# Patient Record
Sex: Female | Born: 1990
Health system: Southern US, Community
[De-identification: ages and names within clinical notes are randomized; demographics above are authoritative.]

## PROBLEM LIST (undated history)

## (undated) DIAGNOSIS — K219 Gastro-esophageal reflux disease without esophagitis: Secondary | ICD-10-CM

## (undated) DIAGNOSIS — R87629 Unspecified abnormal cytological findings in specimens from vagina: Secondary | ICD-10-CM

## (undated) DIAGNOSIS — J45909 Unspecified asthma, uncomplicated: Secondary | ICD-10-CM

## (undated) DIAGNOSIS — B029 Zoster without complications: Secondary | ICD-10-CM

## (undated) DIAGNOSIS — Z8719 Personal history of other diseases of the digestive system: Secondary | ICD-10-CM

## (undated) DIAGNOSIS — N189 Chronic kidney disease, unspecified: Secondary | ICD-10-CM

## (undated) DIAGNOSIS — E230 Hypopituitarism: Secondary | ICD-10-CM

## (undated) DIAGNOSIS — Z8711 Personal history of peptic ulcer disease: Secondary | ICD-10-CM

## (undated) DIAGNOSIS — K224 Dyskinesia of esophagus: Secondary | ICD-10-CM

## (undated) DIAGNOSIS — E039 Hypothyroidism, unspecified: Secondary | ICD-10-CM

## (undated) DIAGNOSIS — E063 Autoimmune thyroiditis: Secondary | ICD-10-CM

## (undated) DIAGNOSIS — K900 Celiac disease: Secondary | ICD-10-CM

## (undated) DIAGNOSIS — M1990 Unspecified osteoarthritis, unspecified site: Secondary | ICD-10-CM

## (undated) HISTORY — DX: Chronic kidney disease, unspecified: N18.9

## (undated) HISTORY — DX: Zoster without complications: B02.9

## (undated) HISTORY — DX: Personal history of other diseases of the digestive system: Z87.19

## (undated) HISTORY — DX: Unspecified asthma, uncomplicated: J45.909

## (undated) HISTORY — PX: NASAL SEPTUM SURGERY: SHX37

## (undated) HISTORY — PX: LEEP: SHX91

## (undated) HISTORY — DX: Dyskinesia of esophagus: K22.4

## (undated) HISTORY — DX: Personal history of peptic ulcer disease: Z87.11

## (undated) HISTORY — DX: Unspecified abnormal cytological findings in specimens from vagina: R87.629

## (undated) HISTORY — DX: Autoimmune thyroiditis: E06.3

## (undated) HISTORY — DX: Celiac disease: K90.0

## (undated) HISTORY — DX: Hypothyroidism, unspecified: E03.9

## (undated) HISTORY — DX: Unspecified osteoarthritis, unspecified site: M19.90

## (undated) HISTORY — DX: Gastro-esophageal reflux disease without esophagitis: K21.9

## (undated) HISTORY — DX: Hypopituitarism: E23.0

---
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
## 2010-03-30 ENCOUNTER — Ambulatory Visit: Payer: Self-pay | Admitting: Internal Medicine

## 2010-03-30 DIAGNOSIS — Z87898 Personal history of other specified conditions: Secondary | ICD-10-CM | POA: Insufficient documentation

## 2010-03-30 DIAGNOSIS — J45909 Unspecified asthma, uncomplicated: Secondary | ICD-10-CM | POA: Insufficient documentation

## 2010-03-30 DIAGNOSIS — F6389 Other impulse disorders: Secondary | ICD-10-CM | POA: Insufficient documentation

## 2010-07-10 ENCOUNTER — Ambulatory Visit: Payer: Self-pay | Admitting: Sports Medicine

## 2010-07-10 DIAGNOSIS — S335XXA Sprain of ligaments of lumbar spine, initial encounter: Secondary | ICD-10-CM

## 2010-07-10 DIAGNOSIS — M533 Sacrococcygeal disorders, not elsewhere classified: Secondary | ICD-10-CM | POA: Insufficient documentation

## 2010-07-10 DIAGNOSIS — M2170 Unequal limb length (acquired), unspecified site: Secondary | ICD-10-CM | POA: Insufficient documentation

## 2010-07-10 DIAGNOSIS — S339XXA Sprain of unspecified parts of lumbar spine and pelvis, initial encounter: Secondary | ICD-10-CM | POA: Insufficient documentation

## 2010-09-18 NOTE — Assessment & Plan Note (Signed)
Summary: TO BE EST/OK PER DOC/NJR   Vital Signs:  Patient profile:   20 year old female Menstrual status:  regular LMP:     03/18/2010 Height:      65 inches Weight:      119 pounds BMI:     19.87 Temp:     98.6 degrees F oral Pulse rate:   72 / minute BP sitting:   110 / 70  (left arm) Cuff size:   regular  Vitals Entered By: Sherron Monday, CMA (AAMA) (March 30, 2010 11:31 AM) CC: New Pt to get establish- Discuss asthma vs allergies. Pt is having trouble breathing. Mom would like pt to have thyroid checked as well. LMP (date): 03/18/2010     Menstrual Status regular Enter LMP: 03/18/2010   CC:  New Pt to get establish- Discuss asthma vs allergies. Pt is having trouble breathing. Mom would like pt to have thyroid checked as well.Marland Kitchen  History of Present Illness: new patient she describes SOB and chest tightness hx of asthma and allergies She describes occasional sensation of needing to take a deep breath. she denies wheezing.  Sensation typically occurs at rest. She is able to exercise aggressively   All other systems reviewed and were negative    Preventive Screening-Counseling & Management  Alcohol-Tobacco     Alcohol drinks/day: 0     Smoking Status: never  Caffeine-Diet-Exercise     Caffeine use/day: 0     Does Patient Exercise: yes      Drug Use:  no.    Current Medications (verified): 1)  None  Allergies (verified): No Known Drug Allergies  Past History:  Family History: Last updated: 03/30/2010 Father: High cholesterol, HBP Mother: Graves  Siblings: Sister- Healthy  Social History: Last updated: 03/30/2010 Never Smoked Alcohol use-no Drug use-no Regular exercise-yes Occupation: [employer]  Risk Factors: Alcohol Use: 0 (03/30/2010) Caffeine Use: 0 (03/30/2010) Exercise: yes (03/30/2010)  Risk Factors: Smoking Status: never (03/30/2010)  Past Medical History: Asthma  Past Surgical History: Broken Nose and Deviated  Septum- 2 separate surgeries  Family History: Father: High cholesterol, HBP Mother: Graves  Siblings: Sister- Healthy  Social History: Never Smoked Alcohol use-no Drug use-no Regular exercise-yes Occupation: [employer] Smoking Status:  never Caffeine use/day:  0 Does Patient Exercise:  yes Drug Use:  no   Impression & Recommendations:  Problem # 1:  ASTHMA (ICD-493.90)  Her updated medication list for this problem includes:    Proair Hfa 108 (90 Base) Mcg/act Aers (Albuterol sulfate) .Marland Kitchen... 2 puffs four times a day as needed  Orders: Venipuncture (95284) Specimen Handling (99000) TLB-TSH (Thyroid Stimulating Hormone) (84443-TSH)  Complete Medication List: 1)  Proair Hfa 108 (90 Base) Mcg/act Aers (Albuterol sulfate) .... 2 puffs four times a day as needed Prescriptions: PROAIR HFA 108 (90 BASE) MCG/ACT  AERS (ALBUTEROL SULFATE) 2 puffs four times a day as needed  #1 x 0   Entered and Authorized by:   Phoebe Sharps MD   Signed by:   Phoebe Sharps MD on 03/30/2010   Method used:   Electronically to        [facility]. [phone]* (retail)       [address]       [place], [place]  [address]       Ph: [phone] or [phone]       Fax: [phone]   RxID:   [id number]

## 2010-09-18 NOTE — Assessment & Plan Note (Signed)
Summary: PER FIELDS,MC   Vital Signs:  Patient profile:   20 year old female Menstrual status:  regular Height:      65.5 inches Weight:      120 pounds BMI:     19.74 Pulse rate:   93 / minute BP sitting:   104 / 68  (left arm)  Vitals Entered By: Caesar Chestnut RN (July 10, 2010 10:25 AM) CC: rt mid/lower back pain x1 week   CC:  rt mid/lower back pain x1 week.  History of Present Illness: 8 days ago woke up with RT sided back pain this has persisted inspite of naproxen and methocarbamol carries back pack woke up with this and gennerally felt bad that day [facility] that was normal  over past 8 days not much change back feels stiff pain loclizes to RT SI jont area Dr Earle Gell is a friend and she suggested coming here    Preventive Screening-Counseling & Management  Alcohol-Tobacco     Smoking Status: never  Allergies (verified): No Known Drug Allergies  Physical Exam  General:  Well-developed,well-nourished,in no acute distress; alert,appropriate and cooperative throughout examination Msk:  RT leg is longer than left by 1.5 cms  has spasm over RT quad lumborum Tight SIJ on RT that is tender to touch Pretzel stretch tight on RT only weak on hip abductors bilat  norm flex, ext, rotation and lat bending but some referred pain to quad lumb  norm heel , toe, tandem walk norm neuro ck   Impression & Recommendations:  Problem # 1:  SPRAIN AND STRAIN OF LUMBOSACRAL (ICD-846.0) Has strain primarily to quad lumborum mm also involves ligs to SIJ  use heat, ibuprofen as needed exercises stop methocarbamol  Problem # 2:  SACROILIAC JOINT DYSFUNCTION (ICD-724.6) The RT seems locked 2/2 leg length prob  will work with exercises and stretches  reck if not responding  Problem # 3:  UNEQUAL LEG LENGTH (ICD-736.81) wedged lift added to left shoe to try to correct  if comfortable use this in future in other  shoes  note gait is more level once this is placed  Complete Medication List: 1)  Proair Hfa 108 (90 Base) Mcg/act Aers (Albuterol sulfate) .... 2 puffs four times a day as needed  Patient Instructions: 1)  Pretzel stretch 3 x 30 seconds 2)  Knee to opposite shoulde rstretch 3 x 30 secs 3)  wear slight lift in left shoe 4)  daily a set of exercises 5)  start with easy back motion side to side 6)  forward and back 7)  rotation   Orders Added: 1)  New Patient Level III [39030]

## 2011-07-08 ENCOUNTER — Other Ambulatory Visit: Payer: Self-pay | Admitting: Anesthesiology

## 2011-07-08 DIAGNOSIS — R519 Headache, unspecified: Secondary | ICD-10-CM

## 2011-07-09 ENCOUNTER — Ambulatory Visit
Admission: RE | Admit: 2011-07-09 | Discharge: 2011-07-09 | Disposition: A | Discharge status: Home / Self Care | Payer: Managed Care, Other (non HMO) | Source: Ambulatory Visit | Attending: Anesthesiology | Admitting: Anesthesiology

## 2011-07-09 DIAGNOSIS — R519 Headache, unspecified: Secondary | ICD-10-CM

## 2011-09-23 ENCOUNTER — Encounter: Payer: Self-pay | Admitting: Internal Medicine

## 2011-09-23 ENCOUNTER — Telehealth: Payer: Self-pay | Admitting: Internal Medicine

## 2011-09-23 ENCOUNTER — Ambulatory Visit (INDEPENDENT_AMBULATORY_CARE_PROVIDER_SITE_OTHER): Payer: Managed Care, Other (non HMO) | Admitting: Internal Medicine

## 2011-09-23 ENCOUNTER — Ambulatory Visit
Admission: RE | Admit: 2011-09-23 | Discharge: 2011-09-23 | Disposition: A | Discharge status: Home / Self Care | Payer: Managed Care, Other (non HMO) | Source: Ambulatory Visit | Attending: Internal Medicine | Admitting: Internal Medicine

## 2011-09-23 DIAGNOSIS — J45909 Unspecified asthma, uncomplicated: Secondary | ICD-10-CM

## 2011-09-23 DIAGNOSIS — R109 Unspecified abdominal pain: Secondary | ICD-10-CM

## 2011-09-23 MED ORDER — TRAMADOL HCL 50 MG PO TABS: 50.0000 mg | ORAL_TABLET | Freq: Three times a day (TID) | ORAL | Status: AC | PRN

## 2011-09-23 MED ORDER — PROMETHAZINE HCL 12.5 MG PO TABS: 12.5000 mg | ORAL_TABLET | Freq: Four times a day (QID) | ORAL | Status: DC | PRN

## 2011-09-23 NOTE — Progress Notes (Signed)
  Subjective:    Patient ID: Kristine Taylor, female    DOB: 06/09/1991, 21 y.o.   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 This has worsened over the past 2 weeks and involves the left lower mid and upper left abdominal regions. There some discomfort also in the right flank area. No change in bowel habits but she has had some anorexia and nausea. She describes an increased satiety and some worsening discomfort following meals    Review of Systems  Constitutional: Negative.   HENT: Negative for hearing loss, congestion, sore throat, rhinorrhea, dental problem, sinus pressure and tinnitus.   Eyes: Negative for pain, discharge and visual disturbance.  Respiratory: Negative for cough and shortness of breath.   Cardiovascular: Negative for chest pain, palpitations and leg swelling.  Gastrointestinal: Positive for nausea and abdominal pain. Negative for vomiting, diarrhea, constipation, blood in stool, abdominal distention and rectal pain.  Genitourinary: Negative for dysuria, urgency, frequency, hematuria, flank pain, vaginal bleeding, vaginal discharge, difficulty urinating, vaginal pain and pelvic pain.  Musculoskeletal: Negative for joint swelling, arthralgias and gait problem.  Skin: Negative for rash.  Neurological: Negative for dizziness, syncope, speech difficulty, weakness, numbness and headaches.  Hematological: Negative for adenopathy.  Psychiatric/Behavioral: Negative for behavioral problems, dysphoric mood and agitation. The patient is not nervous/anxious.        Objective:   Physical Exam  Constitutional: She is oriented to person, place, and time. She appears well-developed and well-nourished. No distress.  Neck: No thyromegaly present.  Cardiovascular: Normal rate, regular rhythm, normal heart sounds and intact distal pulses.     Pulmonary/Chest: Effort normal and breath sounds normal. No respiratory distress. She has no wheezes. She has no rales. She exhibits no tenderness.  Abdominal: Soft. Bowel sounds are normal. She exhibits no distension and no mass. There is no tenderness. There is no rebound and no guarding.  Musculoskeletal: Normal range of motion.  Lymphadenopathy:    She has no cervical adenopathy.  Neurological: She is alert and oriented to person, place, and time.  Skin: Skin is warm and dry. No rash noted.  Psychiatric: She has a normal mood and affect. Her behavior is normal.          Assessment & Plan:     Left-sided abdominal pain of 2 weeks' duration associated nausea and anorexia. Pain seems to be worsening. We'll try to obtain an abdominal ultrasound today.

## 2011-09-23 NOTE — Patient Instructions (Signed)
Abdominal ultrasound as discussed today  Maintain hydration by drinking small amounts of clear fluids frequently, then soft diet, and then advance diet as tolerated.   Call if symptoms worsen, high fever, severe weakness or fainting, increased abdominal pain, blood in stool or vomit, or failure to improve in 2-3 days.

## 2011-09-23 NOTE — Telephone Encounter (Signed)
Please call pt on cell with abd ultrasound results

## 2011-09-26 NOTE — Telephone Encounter (Signed)
Pls advise.  

## 2011-10-17 NOTE — Telephone Encounter (Signed)
Left a message for pt to return call 

## 2011-10-17 NOTE — Telephone Encounter (Signed)
Please call/notify patient that lab/test/procedure is normal- how his patient doing??

## 2011-10-17 NOTE — Telephone Encounter (Signed)
Pt states she is still experiencing the same pain on the left side.  Pt has been on a gluten free diet for the last 10 days but pt's pain is still occurring.

## 2011-11-26 ENCOUNTER — Other Ambulatory Visit: Payer: Self-pay | Admitting: Gastroenterology

## 2011-11-26 DIAGNOSIS — R1032 Left lower quadrant pain: Secondary | ICD-10-CM

## 2011-11-28 ENCOUNTER — Ambulatory Visit
Admission: RE | Admit: 2011-11-28 | Discharge: 2011-11-28 | Disposition: A | Discharge status: Home / Self Care | Payer: Managed Care, Other (non HMO) | Source: Ambulatory Visit | Attending: Gastroenterology | Admitting: Gastroenterology

## 2011-11-28 DIAGNOSIS — R1032 Left lower quadrant pain: Secondary | ICD-10-CM

## 2011-11-28 MED ORDER — IOHEXOL 300 MG/ML  SOLN
100.0000 mL | Freq: Once | INTRAMUSCULAR | Status: AC | PRN
  Administered 2011-11-28: 100 mL via INTRAVENOUS

## 2012-04-17 DIAGNOSIS — G629 Polyneuropathy, unspecified: Secondary | ICD-10-CM | POA: Insufficient documentation

## 2012-05-01 DIAGNOSIS — G90521 Complex regional pain syndrome I of right lower limb: Secondary | ICD-10-CM | POA: Insufficient documentation

## 2012-11-19 ENCOUNTER — Other Ambulatory Visit: Payer: Self-pay | Admitting: Anesthesiology

## 2012-11-19 DIAGNOSIS — G5780 Other specified mononeuropathies of unspecified lower limb: Secondary | ICD-10-CM

## 2012-11-19 DIAGNOSIS — G609 Hereditary and idiopathic neuropathy, unspecified: Secondary | ICD-10-CM

## 2012-11-19 DIAGNOSIS — M545 Low back pain, unspecified: Secondary | ICD-10-CM

## 2012-11-19 DIAGNOSIS — IMO0002 Reserved for concepts with insufficient information to code with codable children: Secondary | ICD-10-CM

## 2012-11-25 ENCOUNTER — Other Ambulatory Visit: Payer: Managed Care, Other (non HMO)

## 2013-12-08 DIAGNOSIS — G894 Chronic pain syndrome: Secondary | ICD-10-CM | POA: Insufficient documentation

## 2013-12-08 DIAGNOSIS — M5416 Radiculopathy, lumbar region: Secondary | ICD-10-CM | POA: Insufficient documentation

## 2015-08-20 HISTORY — PX: ESOPHAGEAL MANOMETRY: SHX1526

## 2015-08-20 HISTORY — PX: PH IMPEDANCE STUDY: SHX5565

## 2015-12-18 HISTORY — PX: ESOPHAGOGASTRODUODENOSCOPY: SHX1529

## 2016-03-12 NOTE — Procedures (Signed)
Performed IN:[facility],  Florentina Addison on March 25, 2016 15:48 EDT               Complete swallows wih liquid and viscous. Reflux episodes no tin excess. trong correlaion between regurg symptoms and actual reflux. Acid well controlled by Dexilant at all times. Fragmented peristalsis with large breaks seen 50% of the time. This can be seen in reflux or in normal patients and is of unclear significance.    Plan:  Recomend empiric bethanecol to treat reflux and perceived dysphagia.  Signature Line     Electronically Signed on 03/25/2016 03:48 PM EDT   ________________________________________________   Laren Boom

## 2016-05-27 NOTE — Nursing Note (Signed)
Nursing Discharge Summary - Text       Nursing Discharge Summary Entered On:  05/27/2016 23:08 EDT    Performed On:  05/27/2016 23:06 EDT by Karolee OhsGREGOR, RN, NANCY A               DC Information   Discharge To, Anticipated :   Home with family support   Mode of Discharge :   Ambulatory   Transportation :   Private vehicle   Accompanied By :   Darlis LoanSpouse   GREGOR, RN, NANCY A - 05/27/2016 23:06 EDT   Education   Responsible Learner(s) :   No Data Available     Barriers To Learning :   None evident   Teaching Method :   Explanation, Printed materials   GREGOR, RN, Harriett SineNANCY A - 05/27/2016 23:06 EDT   Post-Hospital Education Adult Grid   Diagnostic Results :   Trenton GammonVerbalizes understanding   Disease Process :   Verbalizes understanding   Importance of Follow-Up Visits :   Verbalizes understanding   Plan of Care :   Verbalizes understanding   When to Call Health Care Provider :   [facility]   GREGOR, RN, Harriett SineNANCY A - 05/27/2016 23:06 EDT   Education Referral Made To :   Other: insrtucted to f/u with pcp and return to er for worsening   Additional Learner(s) Present :   Spouse, Significant other   Time Spent Educating Patient :   8 minutes   GREGOR, RN, NANCY A - 05/27/2016 23:06 EDT

## 2016-08-06 DIAGNOSIS — R1310 Dysphagia, unspecified: Secondary | ICD-10-CM | POA: Insufficient documentation

## 2016-08-19 HISTORY — PX: LEEP: SHX91

## 2018-06-03 LAB — VITAMIN B12: Vitamin B-12: 469 pg/mL (ref 232–1245)

## 2018-06-03 LAB — COMPREHENSIVE METABOLIC PANEL
ALT: 21 U/L (ref 0–33)
AST: 21 U/L (ref 0–32)
Albumin/Globulin Ratio: 1.9 mmol/L (ref 1.00–2.00)
Albumin: 4.8 g/dL (ref 3.5–5.2)
Alk Phosphatase: 55 U/L (ref 35–117)
Anion Gap: 9 mmol/L (ref 2–17)
BUN: 10 mg/dL (ref 6–20)
CO2: 30 mmol/L — ABNORMAL HIGH (ref 22–29)
Calcium: 9.6 mg/dL (ref 8.6–10.0)
Chloride: 103 mmol/L (ref 98–107)
Creatinine: 0.5 mg/dL (ref 0.5–0.9)
GFR African American: 154 mL/min/{1.73_m2} (ref >=90)
GFR Non-African American: 133 mL/min/{1.73_m2} (ref >=90)
Globulin: 3 g/dL (ref 1.9–4.4)
Glucose: 100 mg/dL — ABNORMAL HIGH (ref 70–99)
OSMOLALITY CALCULATED: 282 mOsm/kg (ref 270–287)
Potassium: 4 mmol/L (ref 3.5–5.3)
Sodium: 142 mmol/L (ref 135–145)
Total Bilirubin: 0.3 mg/dL (ref 0.00–1.20)
Total Protein: 7.3 g/dL (ref 6.4–8.3)

## 2018-06-03 LAB — CBC WITH AUTO DIFFERENTIAL
Absolute Eos #: 0.1 10*3/uL (ref 0.0–0.5)
Absolute Lymph #: 2 10*3/uL (ref 1.0–3.2)
Absolute Mono #: 0.5 10*3/uL (ref 0.3–1.0)
Basophils %: 0.6 % (ref 0.0–2.0)
Eosinophils %: 1.4 % (ref 0.0–7.0)
Hematocrit: 40.7 % (ref 34.0–47.0)
Hemoglobin: 14 g/dL (ref 11.5–15.7)
Lymphocytes: 39 % (ref 15.0–45.0)
MCH: 30.5 pg (ref 27.0–34.5)
MCHC: 34.3 g/dL (ref 32.0–36.0)
MCV: 89 fL (ref 81.0–99.0)
MPV: 8.9 fL (ref 7.2–13.2)
Monocytes: 10.3 % (ref 4.0–12.0)
Neutrophils %: 48.7 % (ref 42.0–74.0)
Neutrophils Absolute: 2.5 10*3/uL (ref 1.6–7.3)
Platelets: 243 10*3/uL (ref 140–440)
RBC: 4.58 x10e6/mcL (ref 3.60–5.20)
RDW: 12.9 % (ref 11.0–16.0)
WBC: 5.2 10*3/uL (ref 3.8–10.6)

## 2018-06-03 LAB — FERRITIN: Ferritin: 24.3 ng/mL (ref 13.0–150.0)

## 2018-06-03 LAB — TSH WITH REFLEX TO FT4: TSH: 2.74 mcIU/mL (ref 0.358–3.740)

## 2018-06-03 LAB — IRON: Iron: 77 ug/dL (ref 37–145)

## 2018-06-03 LAB — VITAMIN D 25 HYDROXY: Vit D, 25-Hydroxy: 31.3 ng/mL (ref 30.0–90.0)

## 2018-08-19 NOTE — L&D Delivery Note (Signed)
Delivery Note At 5:12 PM a viable female was delivered via Vaginal, Spontaneous (Presentation:DOA ;  ).  APGAR: 9, 9; weight  . pending  Placenta status: spont, intact, .  Cord: 3VC with the following complications: .  Cord pH: n/a  Anesthesia:  epidural Episiotomy: Left Mediolateral Lacerations: Periurethral- b/l Suture Repair: 3.0 vicryl rapide 4.0 vicryl rapideto b/l periurethral lacerations, interrupted sutrues, 4 on R, 1 on L Est. Blood Loss (mL): 200  Mom to postpartum.  Baby to Couplet care / Skin to Skin.  Ala Dach 07/23/2019, 5:47 PM

## 2018-08-25 ENCOUNTER — Encounter: Payer: Self-pay | Admitting: Internal Medicine

## 2018-08-26 ENCOUNTER — Ambulatory Visit: Payer: Self-pay | Admitting: Family Medicine

## 2018-09-04 ENCOUNTER — Encounter: Payer: Self-pay | Admitting: Family Medicine

## 2018-09-04 ENCOUNTER — Ambulatory Visit (INDEPENDENT_AMBULATORY_CARE_PROVIDER_SITE_OTHER): Payer: Managed Care, Other (non HMO) | Admitting: Family Medicine

## 2018-09-04 VITALS — BP 94/62 | HR 78 | Temp 98.2°F | Ht 65.0 in | Wt 118.0 lb

## 2018-09-04 DIAGNOSIS — M1990 Unspecified osteoarthritis, unspecified site: Secondary | ICD-10-CM

## 2018-09-04 DIAGNOSIS — K900 Celiac disease: Secondary | ICD-10-CM

## 2018-09-04 DIAGNOSIS — Z0000 Encounter for general adult medical examination without abnormal findings: Secondary | ICD-10-CM | POA: Diagnosis not present

## 2018-09-04 DIAGNOSIS — K5900 Constipation, unspecified: Secondary | ICD-10-CM | POA: Diagnosis not present

## 2018-09-04 DIAGNOSIS — E039 Hypothyroidism, unspecified: Secondary | ICD-10-CM | POA: Insufficient documentation

## 2018-09-04 DIAGNOSIS — N12 Tubulo-interstitial nephritis, not specified as acute or chronic: Secondary | ICD-10-CM | POA: Insufficient documentation

## 2018-09-04 DIAGNOSIS — Z8719 Personal history of other diseases of the digestive system: Secondary | ICD-10-CM

## 2018-09-04 DIAGNOSIS — K224 Dyskinesia of esophagus: Secondary | ICD-10-CM | POA: Diagnosis not present

## 2018-09-04 MED ORDER — HYDROCORTISONE ACETATE 25 MG RE SUPP: 25.0000 mg | Freq: Two times a day (BID) | RECTAL | 0 refills | Status: DC

## 2018-09-04 NOTE — Progress Notes (Signed)
Subjective:     Kristine Taylor is a 28 y.o. female with pmh sig for shingles (2008), hypothyroidism, history of asthma, arthritis, esophageal spasm, motility disorder, constipation, gastric ulcers  who is here for a comprehensive physical exam.   H/o hemorrhoids: -pt notes episode of tissue coming out of her anus several yrs ago. -pt had recent episode.  Notes looked like a "red tennis ball" sticking out -pt denies pruritus, pain -Endorses history of constipation -Has not taken nothing for her symptoms.  States tissue is typically "setback" after a few minutes at most.  History of GI issues: -Patient endorses history of esophageal spasms, motility disorder, celiac disease, constipation -Has upcoming GI appointment in February. -Patient notes eating meds make symptoms worse -Was taking Zantac x3 years prior to medication recall -Now taking Dexilant  Hypothyroidism: -Diagnosed January 2019 after having increased hair thinning. -Currently taking levothyroxine 50 mcg daily -Last TSH was in October -Endorses irregular menses,?  Continued hair thinning?  History of asthma: -Mostly as a child -No symptoms since  Fertility difficulty: -endorses history of irregular/absent menses for years -Has appointment with reproductive endocrinology at the end of February.  -Currently taking letrozole on day 3 through 7 of cycle and Provera -LMP 08/28/2018 -Patient endorses history of abnormal Pap in the past -Last Pap October 2019  Arthritis: -Patient endorses occasional pain in low back, L4-5 -Was taking Flexeril and another med, but stopped as then made her drowsy -received steroid injections in the past  History of kidney disease: -Pt does not recall details -States was [facility]  Allergies: NKDA  Past surgical history: Rhinoplasty Deviated septum repair Wisdom teeth extraction  Social history: Patient is married.  She and her husband are trying to have  children.  Patient is currently looking for employment since moving to the area from [place].  Patient endorses social alcohol use.  Patient denies tobacco and drug use.  Family medical history: Mom-AAW Dad-AW Sister-alive, asthma PGM-deceased, diabetes Social History   Socioeconomic History  . Marital status: Unknown    Spouse name: Not on file  . Number of children: Not on file  . Years of education: Not on file  . Highest education level: Not on file  Occupational History  . Not on file  Social Needs  . Financial resource strain: Not on file  . Food insecurity:    Worry: Not on file    Inability: Not on file  . Transportation needs:    Medical: Not on file    Non-medical: Not on file  Tobacco Use  . Smoking status: Never Smoker  . Smokeless tobacco: Never Used  Substance and Sexual Activity  . Alcohol use: Yes    Comment: OCCASIONAL  . Drug use: Never  . Sexual activity: Not on file  Lifestyle  . Physical activity:    Days per week: Not on file    Minutes per session: Not on file  . Stress: Not on file  Relationships  . Social connections:    Talks on phone: Not on file    Gets together: Not on file    Attends religious service: Not on file    Active member of club or organization: Not on file    Attends meetings of clubs or organizations: Not on file    Relationship status: Not on file  . Intimate partner violence:    Fear of current or ex partner: Not on file    Emotionally abused: Not on file  Physically abused: Not on file    Forced sexual activity: Not on file  Other Topics Concern  . Not on file  Social History Narrative  . Not on file   Health Maintenance  Topic Date Due  . HIV Screening  06/30/2005  . TETANUS/TDAP  06/30/2009  . PAP-Cervical Cytology Screening  07/01/2011  . PAP SMEAR-Modifier  07/01/2011  . INFLUENZA VACCINE  03/19/2018    The following portions of the patient's history were reviewed and updated as appropriate: allergies,  current medications, past family history, past medical history, past social history, past surgical history and problem list.  Review of Systems Pertinent items noted in HPI and remainder of comprehensive ROS otherwise negative.   Objective:    BP 94/62 (BP Location: Left Arm, Patient Position: Sitting, Cuff Size: Normal)   Pulse 78   Temp 98.2 F (36.8 C) (Oral)   Ht 5' 5"  (1.651 m)   Wt 118 lb (53.5 kg)   SpO2 98%   BMI 19.64 kg/m  General appearance: alert, cooperative and no distress Head: Normocephalic, without obvious abnormality, atraumatic Eyes: conjunctivae/corneas clear. PERRL, EOM's intact. Fundi benign. Ears: normal TM's and external ear canals both ears Nose: Nares normal. Septum midline. Mucosa normal. No drainage or sinus tenderness. Throat: lips, mucosa, and tongue normal; teeth and gums normal Neck: no adenopathy, no carotid bruit, no JVD, supple, symmetrical, trachea midline and thyroid not enlarged, symmetric, no tenderness/mass/nodules Lungs: clear to auscultation bilaterally Heart: regular rate and rhythm, S1, S2 normal, no murmur, click, rub or gallop Abdomen: soft, non-tender; bowel sounds normal; no masses,  no organomegaly Extremities: extremities normal, atraumatic, no cyanosis or edema Skin: Skin color, texture, turgor normal. No rashes or lesions Neurologic: Alert and oriented X 3, normal strength and tone. Normal symmetric reflexes. Normal coordination and gait    Assessment:    Healthy female exam.     Plan:     Anticipatory guidance given including wearing seatbelts, smoke detectors in the home, increasing physical activity, increasing p.o. intake of water and vegetables. -will wait on labs as will likely have them done at Reproductive Endo visit -pap up-to-date.  Done October 2019 -Immunizations up-to-date -Given handout -Next CPE in 1 year See After Visit Summary for Counseling Recommendations    History of hemorrhoids -Rx for Anusol  given  Celiac disease -Avoid gluten -Encouraged to keep follow-up with GI in February  Constipation: -Discussed increasing p.o. intake of fiber, considering low FODMAP diet, increasing p.o. intake of water -Continue Dexilant -Keep follow-up with GI in February  Esophageal spasm -Avoid foods known to cause symptoms -Encouraged to keep follow-up with GI in February  Hypothyroidism: -We will wait on labs -Continue levothyroxine 50 mcg daily -We will order labs if needed after patient's follow-up with reproductive endocrinology  Arthritis -Discussed stretching and other ways to relieve pain/discomfort -Use NSAIDs sparingly given GI issues  Follow-up PRN  Grier Mitts, MD

## 2018-09-04 NOTE — Patient Instructions (Signed)
Preventive Care 18-39 Years, Female Preventive care refers to lifestyle choices and visits with your health care provider that can promote health and wellness. What does preventive care include?   A yearly physical exam. This is also called an annual well check.  Dental exams once or twice a year.  Routine eye exams. Ask your health care provider how often you should have your eyes checked.  Personal lifestyle choices, including: ? Daily care of your teeth and gums. ? Regular physical activity. ? Eating a healthy diet. ? Avoiding tobacco and drug use. ? Limiting alcohol use. ? Practicing safe sex. ? Taking vitamin and mineral supplements as recommended by your health care provider. What happens during an annual well check? The services and screenings done by your health care provider during your annual well check will depend on your age, overall health, lifestyle risk factors, and family history of disease. Counseling Your health care provider may ask you questions about your:  Alcohol use.  Tobacco use.  Drug use.  Emotional well-being.  Home and relationship well-being.  Sexual activity.  Eating habits.  Work and work environment.  Method of birth control.  Menstrual cycle.  Pregnancy history. Screening You may have the following tests or measurements:  Height, weight, and BMI.  Diabetes screening. This is done by checking your blood sugar (glucose) after you have not eaten for a while (fasting).  Blood pressure.  Lipid and cholesterol levels. These may be checked every 5 years starting at age 20.  Skin check.  Hepatitis C blood test.  Hepatitis B blood test.  Sexually transmitted disease (STD) testing.  BRCA-related cancer screening. This may be done if you have a family history of breast, ovarian, tubal, or peritoneal cancers.  Pelvic exam and Pap test. This may be done every 3 years starting at age 21. Starting at age 30, this may be done every 5  years if you have a Pap test in combination with an HPV test. Discuss your test results, treatment options, and if necessary, the need for more tests with your health care provider. Vaccines Your health care provider may recommend certain vaccines, such as:  Influenza vaccine. This is recommended every year.  Tetanus, diphtheria, and acellular pertussis (Tdap, Td) vaccine. You may need a Td booster every 10 years.  Varicella vaccine. You may need this if you have not been vaccinated.  HPV vaccine. If you are 26 or younger, you may need three doses over 6 months.  Measles, mumps, and rubella (MMR) vaccine. You may need at least one dose of MMR. You may also need a second dose.  Pneumococcal 13-valent conjugate (PCV13) vaccine. You may need this if you have certain conditions and were not previously vaccinated.  Pneumococcal polysaccharide (PPSV23) vaccine. You may need one or two doses if you smoke cigarettes or if you have certain conditions.  Meningococcal vaccine. One dose is recommended if you are age 19-21 years and a first-year college student living in a residence hall, or if you have one of several medical conditions. You may also need additional booster doses.  Hepatitis A vaccine. You may need this if you have certain conditions or if you travel or work in places where you may be exposed to hepatitis A.  Hepatitis B vaccine. You may need this if you have certain conditions or if you travel or work in places where you may be exposed to hepatitis B.  Haemophilus influenzae type b (Hib) vaccine. You may need this if you   have certain risk factors. Talk to your health care provider about which screenings and vaccines you need and how often you need them. This information is not intended to replace advice given to you by your health care provider. Make sure you discuss any questions you have with your health care provider. Document Released: 10/01/2001 Document Revised: 03/18/2017  Document Reviewed: 06/06/2015 Elsevier Interactive Patient Education  2019 [employer]  Esophageal Spasm  An esophageal spasm is a sudden tightening (contraction) of the part of the body that moves food from the mouth to the stomach (esophagus). Normally, smooth, wave-like muscle contractions move food and liquids down the esophagus. Esophageal spasms are abnormal muscle contractions that can cause chest pain and trouble swallowing (dysphagia). Spasms may also cause swallowed foods or liquids to come back up into the throat (regurgitation). There are two types of esophageal spasms. You may have one or both types:  Diffuse esophageal spasms. These are irregular, uncoordinated spasms. This type tends to cause more dysphagia.  Nutcracker esophagus. This is a type of spasm in which the muscles move normally, but the contraction is very strong. This type tends to be more painful. Severe esophageal spasms can make it hard to eat and do everyday activities. They often occur with severe heartburn (reflux esophagitis). The symptoms can come and go and may be triggered or worsened depending on your diet or other medical issues. What are the causes? The cause of esophageal spasms is not known. What increases the risk? The following factors may make you more likely to develop esophageal spasms:  Being female.  Age. The risk may increase as you get older.  Depression or anxiety.  Having GERD (gastroesophageal reflux disease). What are the signs or symptoms? Symptoms may vary from day to day. They may be mild or severe. They may last for minutes or hours. Common symptoms include:  Chest pain. This may feel like a heart attack.  Back pain.  Dysphagia.  Heartburn.  A feeling that something is stuck in the throat (globus).  Regurgitation of foods or liquids. For some people, certain things may trigger symptoms, such as:  Certain foods and drinks. These may include very hot or very cold  foods or drinks.  Eating very quickly. How is this diagnosed? This condition may be diagnosed based on your symptoms and a physical exam. You may have tests, such as:  Endoscopy. This involves using a flexible tube that has a camera on the end of it (endoscope) to look down your throat and examine your esophagus.  Barium swallow. This involves drinking a substance that will show up well on X-rays (barium) and then having X-rays to see how the substance moves through your esophagus.  Esophageal manometry. This involves passing a small, thin tube through your nose and down into your throat. The tube contains pressure sensors that measure muscle contractions in the esophagus while you swallow. How is this treated? Mild esophageal spasms may not need treatment. You may be able to manage the spasms by avoiding triggers. For more frequent or severe spasms, treatment may include:  Medicine to: ? Relax the esophageal muscles. ? Relieve muscle spasms (calcium channel blockers and nitrates). ? Relieve pain by blocking nerve endings in the esophagus. This is done with an injection of a toxin (botulinum). ? Relieve heartburn (proton pump inhibitors).  Antidepressant medicines. These are sometimes used to ease symptoms.  Surgery to reduce esophageal muscle contractions (myotomy), in very severe cases. Follow these instructions at home: Eating and  drinking  Keep track of foods, drinks, and habits that trigger spasms or heartburn. Avoid these triggers as much as you can.  Eat meals slowly. Chew food completely before swallowing.  Avoid swallowing foods and drinks when they are very hot or very cold. General instructions  Take over-the-counter and prescription medicines only as told by your health care provider.  Find ways to manage stress, such as regular exercise or meditation.  If you struggle with depression or anxiety, talk with your health care provider about treatment options.  Keep all  follow-up visits as told by your health care provider. This is important. Contact a health care provider if:  Your symptoms get worse or do not get better with medicine.  You are losing weight because of dysphagia.  Your esophageal spasms affect your quality of life, such as your ability to eat. Get help right away if:  You have severe chest pain.  You have chest pain that is different from your usual chest pain.  You have trouble breathing.  You choke. Summary  An esophageal spasm is a sudden tightening (contraction) of the part of the body that moves food from the mouth to the stomach (esophagus). These abnormal muscle contractions can cause chest pain and trouble swallowing (dysphagia).  The cause of esophageal spasms is not known.  Treatment may not be needed for mild spasms. For frequent or more severe spasms, treatment may include medicine, or, for very severe spasms, surgery.  Keep track of foods, drinks, and habits that trigger spasms or heartburn. Avoid these triggers as much as you can. This information is not intended to replace advice given to you by your health care provider. Make sure you discuss any questions you have with your health care provider. Document Released: 10/26/2002 Document Revised: 05/06/2017 Document Reviewed: 05/06/2017 Elsevier Interactive Patient Education  2019 [employer]  Hemorrhoids Hemorrhoids are swollen veins in and around the rectum or anus. There are two types of hemorrhoids:  Internal hemorrhoids. These occur in the veins that are just inside the rectum. They may poke through to the outside and become irritated and painful.  External hemorrhoids. These occur in the veins that are outside the anus and can be felt as a painful swelling or hard lump near the anus. Most hemorrhoids do not cause serious problems, and they can be managed with home treatments such as diet and lifestyle changes. If home treatments do not help the symptoms,  procedures can be done to shrink or remove the hemorrhoids. What are the causes? This condition is caused by increased pressure in the anal area. This pressure may result from various things, including:  Constipation.  Straining to have a bowel movement.  Diarrhea.  Pregnancy.  Obesity.  Sitting for long periods of time.  Heavy lifting or other activity that causes you to strain.  Anal sex.  Riding a bike for a long period of time. What are the signs or symptoms? Symptoms of this condition include:  Pain.  Anal itching or irritation.  Rectal bleeding.  Leakage of stool (feces).  Anal swelling.  One or more lumps around the anus. How is this diagnosed? This condition can often be diagnosed through a visual exam. Other exams or tests may also be done, such as:  An exam that involves feeling the rectal area with a gloved hand (digital rectal exam).  An exam of the anal canal that is done using a small tube (anoscope).  A blood test, if you have lost a  significant amount of blood.  A test to look inside the colon using a flexible tube with a camera on the end (sigmoidoscopy or colonoscopy). How is this treated? This condition can usually be treated at home. However, various procedures may be done if dietary changes, lifestyle changes, and other home treatments do not help your symptoms. These procedures can help make the hemorrhoids smaller or remove them completely. Some of these procedures involve surgery, and others do not. Common procedures include:  Rubber band ligation. Rubber bands are placed at the base of the hemorrhoids to cut off their blood supply.  Sclerotherapy. Medicine is injected into the hemorrhoids to shrink them.  Infrared coagulation. A type of light energy is used to get rid of the hemorrhoids.  Hemorrhoidectomy surgery. The hemorrhoids are surgically removed, and the veins that supply them are tied off.  Stapled hemorrhoidopexy surgery. The  surgeon staples the base of the hemorrhoid to the rectal wall. Follow these instructions at home: Eating and drinking   Eat foods that have a lot of fiber in them, such as whole grains, beans, nuts, fruits, and vegetables.  Ask your health care provider about taking products that have added fiber (fiber supplements).  Reduce the amount of fat in your diet. You can do this by eating low-fat dairy products, eating less red meat, and avoiding processed foods.  Drink enough fluid to keep your urine pale yellow. Managing pain and swelling   Take warm sitz baths for 20 minutes, 3-4 times a day to ease pain and discomfort. You may do this in a bathtub or using a portable sitz bath that fits over the toilet.  If directed, apply ice to the affected area. Using ice packs between sitz baths may be helpful. ? Put ice in a plastic bag. ? Place a towel between your skin and the bag. ? Leave the ice on for 20 minutes, 2-3 times a day. General instructions  Take over-the-counter and prescription medicines only as told by your health care provider.  Use medicated creams or suppositories as told.  Get regular exercise. Ask your health care provider how much and what kind of exercise is best for you. In general, you should do moderate exercise for at least 30 minutes on most days of the week (150 minutes each week). This can include activities such as walking, biking, or yoga.  Go to the bathroom when you have the urge to have a bowel movement. Do not wait.  Avoid straining to have bowel movements.  Keep the anal area dry and clean. Use wet toilet paper or moist towelettes after a bowel movement.  Do not sit on the toilet for long periods of time. This increases blood pooling and pain.  Keep all follow-up visits as told by your health care provider. This is important. Contact a health care provider if you have:  Increasing pain and swelling that are not controlled by treatment or  medicine.  Difficulty having a bowel movement, or you are unable to have a bowel movement.  Pain or inflammation outside the area of the hemorrhoids. Get help right away if you have:  Uncontrolled bleeding from your rectum. Summary  Hemorrhoids are swollen veins in and around the rectum or anus.  Most hemorrhoids can be managed with home treatments such as diet and lifestyle changes.  Taking warm sitz baths can help ease pain and discomfort.  In severe cases, procedures or surgery can be done to shrink or remove the hemorrhoids. This information  is not intended to replace advice given to you by your health care provider. Make sure you discuss any questions you have with your health care provider. Document Released: 08/02/2000 Document Revised: 12/25/2017 Document Reviewed: 12/25/2017 Elsevier Interactive Patient Education  2019 [employer].  Constipation, Adult Constipation is when a person has fewer bowel movements in a week than normal, has difficulty having a bowel movement, or has stools that are dry, hard, or larger than normal. Constipation may be caused by an underlying condition. It may become worse with age if a person takes certain medicines and does not take in enough fluids. Follow these instructions at home: Eating and drinking   Eat foods that have a lot of fiber, such as fresh fruits and vegetables, whole grains, and beans.  Limit foods that are high in fat, low in fiber, or overly processed, such as french fries, hamburgers, cookies, candies, and soda.  Drink enough fluid to keep your urine clear or pale yellow. General instructions  Exercise regularly or as told by your health care provider.  Go to the restroom when you have the urge to go. Do not hold it in.  Take over-the-counter and prescription medicines only as told by your health care provider. These include any fiber supplements.  Practice pelvic floor retraining exercises, such as deep breathing  while relaxing the lower abdomen and pelvic floor relaxation during bowel movements.  Watch your condition for any changes.  Keep all follow-up visits as told by your health care provider. This is important. Contact a health care provider if:  You have pain that gets worse.  You have a fever.  You do not have a bowel movement after 4 days.  You vomit.  You are not hungry.  You lose weight.  You are bleeding from the anus.  You have thin, pencil-like stools. Get help right away if:  You have a fever and your symptoms suddenly get worse.  You leak stool or have blood in your stool.  Your abdomen is bloated.  You have severe pain in your abdomen.  You feel dizzy or you faint. This information is not intended to replace advice given to you by your health care provider. Make sure you discuss any questions you have with your health care provider. Document Released: 05/03/2004 Document Revised: 02/23/2016 Document Reviewed: 01/24/2016 Elsevier Interactive Patient Education  2019 [employer].

## 2018-09-07 ENCOUNTER — Encounter: Payer: Self-pay | Admitting: Internal Medicine

## 2018-09-09 DIAGNOSIS — Z3149 Encounter for other procreative investigation and testing: Secondary | ICD-10-CM | POA: Diagnosis not present

## 2018-09-24 DIAGNOSIS — Z319 Encounter for procreative management, unspecified: Secondary | ICD-10-CM | POA: Diagnosis not present

## 2018-09-26 ENCOUNTER — Ambulatory Visit (HOSPITAL_COMMUNITY)
Admission: EM | Admit: 2018-09-26 | Discharge: 2018-09-26 | Disposition: A | Discharge status: Home / Self Care | Payer: BLUE CROSS/BLUE SHIELD | Attending: Emergency Medicine | Admitting: Emergency Medicine

## 2018-09-26 ENCOUNTER — Encounter (HOSPITAL_COMMUNITY): Payer: Self-pay | Admitting: Emergency Medicine

## 2018-09-26 DIAGNOSIS — R519 Headache, unspecified: Secondary | ICD-10-CM

## 2018-09-26 DIAGNOSIS — R51 Headache: Secondary | ICD-10-CM | POA: Diagnosis not present

## 2018-09-26 MED ORDER — IBUPROFEN 800 MG PO TABS: 800.0000 mg | ORAL_TABLET | Freq: Three times a day (TID) | ORAL | 0 refills | Status: DC

## 2018-09-26 MED ORDER — VALACYCLOVIR HCL 1 G PO TABS: 1000.0000 mg | ORAL_TABLET | Freq: Three times a day (TID) | ORAL | 0 refills | Status: AC

## 2018-09-26 NOTE — ED Provider Notes (Signed)
[place]    CSN: [id number] Arrival date & time: 09/26/18  1001     History   Chief Complaint Chief Complaint  Patient presents with  . Facial Pain    HPI Kristine Taylor is a 28 y.o. female history of asthma, GERD presenting today for evaluation of facial pain.  Patient states that on the left side of her face near her ear as well as temporal area as she has had pain.  Feels as if it is throbbing and tender.  She has not noticed any changes to the skin.  Symptoms began yesterday morning.  States that the pain feels very similar to when she previously had shingles on her back.  She has not noticed any rashes.  She notices pain near her ear with opening mouth and chewing.  She denies associated URI symptoms of congestion, cough or sore throat.  She denies any fevers.  She has not taken anything for symptoms.  Denies any neck swelling or difficulty moving neck.  Denies any vision changes.  Denies scalp tenderness.  HPI  Past Medical History:  Diagnosis Date  . Arthritis   . Asthma   . Asthma   . Celiac disease   . Chronic kidney disease   . GERD (gastroesophageal reflux disease)   . History of stomach ulcers   . Thyroid disease     Patient Active Problem List   Diagnosis Date Noted  . H/O hemorrhoids 09/04/2018  . Celiac disease 09/04/2018  . Constipation 09/04/2018  . Esophageal spasm 09/04/2018  . Acquired hypothyroidism 09/04/2018  . Arthritis 09/04/2018  . SACROILIAC JOINT DYSFUNCTION 07/10/2010  . 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 SPRAIN AND STRAIN OF LUMBOSACRAL 07/10/2010  . 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 ASTHMA 03/30/2010  . SHINGLES, HX OF 03/30/2010    Past Surgical History:  Procedure Laterality Date  . broken nose    . NASAL SEPTUM SURGERY    . NOSE REPAIR  2006    OB History   No obstetric history on file.      Home Medications    Prior to Admission medications   Medication Sig Start Date End Date Taking? Authorizing Provider    albuterol (PROVENTIL HFA;VENTOLIN HFA) 108 (90 BASE) MCG/ACT inhaler Inhale 2 puffs into the lungs every 6 (six) hours as needed.    [provider]  dexlansoprazole (DEXILANT) 60 MG capsule Take 60 mg by mouth daily.    [provider]  hydrocortisone (ANUSOL-HC) 25 MG suppository Place 1 suppository (25 mg total) rectally 2 (two) times daily. 09/04/18   Billie Ruddy, MD  ibuprofen (ADVIL,MOTRIN) 800 MG tablet Take 1 tablet (800 mg total) by mouth 3 (three) times daily. 09/26/18   Daizy Outen C, PA-C  levothyroxine (SYNTHROID, LEVOTHROID) 50 MCG tablet Take 50 mcg by mouth daily before breakfast.    [provider]  metoCLOPramide (REGLAN) 5 MG tablet Take 5 mg by mouth daily.    [provider]  Prenatal Vit-Fe Fumarate-FA (MULTIVITAMIN-PRENATAL) 27-0.8 MG TABS tablet Take 1 tablet by mouth daily at 12 noon.    [provider]  Probiotic Product (PROBIOTIC DAILY PO) Take by mouth daily.    [provider]  valACYclovir (VALTREX) 1000 MG tablet Take 1 tablet (1,000 mg total) by mouth 3 (three) times daily for 14 days. 09/26/18 10/10/18  Azadeh Hyder, Elesa Hacker, PA-C    Family History Family History  Problem Relation Age of Onset  . Graves' disease Mother   .  Hyperlipidemia Father   . Hypertension Father   . Healthy Sister     Social History Social History   Tobacco Use  . Smoking status: Never Smoker  . Smokeless tobacco: Never Used  Substance Use Topics  . Alcohol use: Yes    Comment: OCCASIONAL  . Drug use: Never     Allergies   Patient has no known allergies.   Review of Systems Review of Systems  Constitutional: Negative for activity change, appetite change, chills, fatigue and fever.  HENT: Positive for ear pain. Negative for congestion, rhinorrhea, sinus pressure, sore throat and trouble swallowing.   Eyes: Negative for discharge and redness.  Respiratory: Negative for cough, chest tightness and shortness of breath.    Cardiovascular: Negative for chest pain.  Gastrointestinal: Negative for abdominal pain, diarrhea, nausea and vomiting.  Musculoskeletal: Negative for myalgias.  Skin: Negative for rash.  Neurological: Negative for dizziness, light-headedness and headaches.     Physical Exam Triage Vital Signs ED Triage Vitals [09/26/18 1013]  Enc Vitals Group     BP 97/65     Pulse Rate 88     Resp 18     Temp 98.4 F (36.9 C)     Temp Source Oral     SpO2 99 %     Weight      Height      Head Circumference      Peak Flow      Pain Score 3     Pain Loc      Pain Edu?      Excl. in [place]?    No data found.  Updated Vital Signs BP 97/65 (BP Location: Right Arm)   Pulse 88   Temp 98.4 F (36.9 C) (Oral)   Resp 18   SpO2 99%   Visual Acuity Right Eye Distance:   Left Eye Distance:   Bilateral Distance:    Right Eye Near:   Left Eye Near:    Bilateral Near:     Physical Exam Vitals signs and nursing note reviewed.  Constitutional:      General: She is not in acute distress.    Appearance: She is well-developed.     Comments: No acute distress  HENT:     Head: Normocephalic and atraumatic.     Comments: Mildly tender near area of TMJ on left side, mild tenderness to upper for head near scalp line, no overlying skin changes or erythema, no palpable swelling Nontender on scalp    Ears:     Comments: Bilateral ears without tenderness to palpation of external auricle, tragus and mastoid, EAC's without erythema or swelling, TM's with good bony landmarks and cone of light. Non erythematous.    Nose: Nose normal.     Mouth/Throat:     Comments: Oral mucosa pink and moist, no tonsillar enlargement or exudate. Posterior pharynx patent and nonerythematous, no uvula deviation or swelling. Normal phonation. Able to fully open jaw Eyes:     Extraocular Movements: Extraocular movements intact.     Conjunctiva/sclera: Conjunctivae normal.     Pupils: Pupils are equal, round, and reactive  to light.  Neck:     Musculoskeletal: Neck supple.  Cardiovascular:     Rate and Rhythm: Normal rate and regular rhythm.     Heart sounds: No murmur.  Pulmonary:     Effort: Pulmonary effort is normal. No respiratory distress.     Breath sounds: Normal breath sounds.     Comments: Breathing comfortably at  rest, CTABL, no wheezing, rales or other adventitious sounds auscultated Abdominal:     General: There is no distension.     Palpations: Abdomen is soft.     Tenderness: There is no abdominal tenderness.  Musculoskeletal: Normal range of motion.  Skin:    General: Skin is warm and dry.  Neurological:     Mental Status: She is alert and oriented to person, place, and time.      UC Treatments / Results  Labs (all labs ordered are listed, but only abnormal results are displayed) Labs Reviewed - No data to display  EKG None  Radiology No results found.  Procedures Procedures (including critical care time)  Medications Ordered in UC Medications - No data to display  Initial Impression / Assessment and Plan / UC Course  I have reviewed the triage vital signs and the nursing notes.  Pertinent labs & imaging results that were available during my care of the patient were reviewed by me and considered in my medical decision making (see chart for details).     Symptoms concerning for possible TMJ versus prodrome of pain before onset of shingles rash.  Will treat for both with and will initiate Valtrex 3 times daily, anti-inflammatories if underlying TMJ or inflammation.  Early in course of symptoms, discussed this with patient advised to continue to monitor, follow-up if symptoms changing or worsening, developing new symptoms,Discussed strict return precautions. Patient verbalized understanding and is agreeable with plan.  Final Clinical Impressions(s) / UC Diagnoses   Final diagnoses:  Facial pain     Discharge Instructions     Please begin taking valtrex three times  a day for next 1-2 weeks incase this is the beginning of shingles  This could also be related to inflammation in the jaw joint- please read information attached  Use anti-inflammatories for pain/swelling. You may take up to 800 mg Ibuprofen every 8 hours with food. You may supplement Ibuprofen with Tylenol [phone] mg every 8 hours.   Follow up if breaking out in rash that is encroaching eye, or persistent pain, developing swelling, redness, increased pain   ED Prescriptions    Medication Sig Dispense Auth. Provider   valACYclovir (VALTREX) 1000 MG tablet Take 1 tablet (1,000 mg total) by mouth 3 (three) times daily for 14 days. 42 tablet Lester Platas C, PA-C   ibuprofen (ADVIL,MOTRIN) 800 MG tablet Take 1 tablet (800 mg total) by mouth 3 (three) times daily. 21 tablet Wilder Amodei, Armorel C, PA-C     Controlled Substance Prescriptions [place] Controlled Substance Registry consulted? Not Applicable   Janith Lima, [place] 09/27/18 [phone]

## 2018-09-26 NOTE — Discharge Instructions (Signed)
Please begin taking valtrex three times a day for next 1-2 weeks incase this is the beginning of shingles  This could also be related to inflammation in the jaw joint- please read information attached  Use anti-inflammatories for pain/swelling. You may take up to 800 mg Ibuprofen every 8 hours with food. You may supplement Ibuprofen with Tylenol [phone] mg every 8 hours.   Follow up if breaking out in rash that is encroaching eye, or persistent pain, developing swelling, redness, increased pain

## 2018-09-26 NOTE — ED Triage Notes (Signed)
Pt here for left sided facial pain; pt sts hx of shingles and feels similar

## 2018-09-28 DIAGNOSIS — E039 Hypothyroidism, unspecified: Secondary | ICD-10-CM | POA: Diagnosis not present

## 2018-09-28 DIAGNOSIS — Z319 Encounter for procreative management, unspecified: Secondary | ICD-10-CM | POA: Diagnosis not present

## 2018-09-30 DIAGNOSIS — E221 Hyperprolactinemia: Secondary | ICD-10-CM | POA: Diagnosis not present

## 2018-09-30 DIAGNOSIS — Z3161 Procreative counseling and advice using natural family planning: Secondary | ICD-10-CM | POA: Diagnosis not present

## 2018-09-30 DIAGNOSIS — N912 Amenorrhea, unspecified: Secondary | ICD-10-CM | POA: Diagnosis not present

## 2018-10-01 ENCOUNTER — Ambulatory Visit: Payer: BLUE CROSS/BLUE SHIELD | Admitting: Internal Medicine

## 2018-10-01 ENCOUNTER — Encounter

## 2018-10-01 ENCOUNTER — Encounter: Payer: Self-pay | Admitting: Internal Medicine

## 2018-10-01 VITALS — BP 84/60 | HR 80 | Ht 65.0 in | Wt 119.4 lb

## 2018-10-01 DIAGNOSIS — K623 Rectal prolapse: Secondary | ICD-10-CM | POA: Diagnosis not present

## 2018-10-01 DIAGNOSIS — K224 Dyskinesia of esophagus: Secondary | ICD-10-CM | POA: Diagnosis not present

## 2018-10-01 DIAGNOSIS — K900 Celiac disease: Secondary | ICD-10-CM

## 2018-10-01 DIAGNOSIS — K2100 Gastro-esophageal reflux disease with esophagitis, without bleeding: Secondary | ICD-10-CM

## 2018-10-01 MED ORDER — FAMOTIDINE 20 MG PO TABS: 20.0000 mg | ORAL_TABLET | Freq: Two times a day (BID) | ORAL | 3 refills | Status: DC

## 2018-10-01 NOTE — Patient Instructions (Signed)
  We have sent the following medications to your pharmacy for you to pick up at your convenience: pepcid   Dr Carlean Purl will contact you with plans once he review your records thoroughly.   Hope your shingles get better soon.   I appreciate the opportunity to care for you. Silvano Rusk, MD, [facility]

## 2018-10-01 NOTE — Progress Notes (Signed)
Kristine Taylor 28 y.o. 03-26-1991 [id number]  Assessment & Plan:   Encounter Diagnoses  Name Primary?  . Esophageal dysmotility Yes  . Gastroesophageal reflux disease with esophagitis   . Rectal prolapse?   . Celiac disease    The patient appears to have GERD with esophagitis and an esophageal dysmotility problem that could be triggered by GERD but could also be a primary issue.  There does not seem to be any autoimmune disturbance at least based upon serologies drawn at the [facility] in 2017.  In the past she has done reasonably well with a regimen of long-acting nitrates, Dexilant and Reglan.  She is currently off Reglan because of an elevated prolactin level and amenorrhea.  She is trying to get pregnant.  She is off nitrates because of trying to conceive also.  So her symptoms are a bit worse, she had been using peppermint to try to help with her dysmotility but now it is aggravating her GERD that she cannot take ranitidine so I have prescribed famotidine 20 mg twice daily.  Her previous gastroenterologist had noted that considering surgical treatment of her GERD might be the appropriate thing to do, as he thought she had enough esophageal motility to do so.  This is tricky and he appreciated that.  I think we could consider that at some point, perhaps even consider a TIF procedure but I would get additional input from an esophageal expert either Dr. Silverio Decamp or a tertiary center before we consider that.  At any rate that is not a realistic option right now with her plans to try to conceive.  She gives a history that is suggestive of rectal prolapse but that is awfully unlikely in her age range.  However what she describes does not sound like a hemorrhoidal prolapse.  If this recurs she is to take a picture.  She may not really have celiac disease based upon the testing for the genes performed at [facility], I assume the HLA DQ2 and DQ 8 were negative.  Consider retesting that.   May discuss later.  I asked her she does not have any joint hypermobility or anything else that would suggest some sort of connective tissue disorder which could increase the risk of a prolapse.  It is interesting she had pelvic floor physical therapy as a child.  I am not sure if that fits in or not however.  So at this point will continue treatment with Dexilant and H2 blocker.  I can see her as needed and once or twice a year.  I told her when she left the clinic that I would need to review things in more detail and I would get back to her so I will communicate with her through a letter.  I appreciate the opportunity to care for this patient.   Subjective:   Chief Complaint: esophageal spasms  HPI The patient is a 28 year old married white woman here self-referred through Dr. Bess Harvest (family friend) to establish care because of GERD, esophageal dysmotility and celiac disease.  She has a history of an extensive work-up mostly in [place] though also at the [facility].  She was followed by Dr. Madelin Headings, and I have reviewed numerous records.  Over the years she was found to have success with Reglan Dexilant and Imdur.  When she last saw Dr. Myrla Halsted in June of last year, she was contemplating pregnancy and it had been recommended by her obstetrician gynecologist that [place]  be discontinued so Dr. Myrla Halsted suggested she eat peppermint tabs prior to meals to alleviate spasm and dysphagia.  She has moved back to this area and is still trying to become pregnant, she was amenorrheic for about a year her prolactin levels have been elevated so Reglan was stopped.  She had been taking it every morning.  5 mg.  Previously more frequently but she had gotten down to 1 a day.  So she is having a bit more heartburn issue, she was trying to use ranitidine while taking the peppermint candies to help with her dysphagia but that is not available anymore so she is feeling more  indigestion using the peppermints.  She continues to take Dexilant 60 mg daily, her insurance does not cover it or has not and she has been purchasing it through [place].  She does not use caffeine and she rarely drinks alcohol.  In addition to heartburn symptomatology and dysphagia type problem she equates a chest pain that is intermittent associated with some dyspnea, often when she swallows, and associated with regurgitation.  Foods that are too thick, too cold or too hot tend to trigger this intermittently.  She has not lost weight.  These problems began at about the age of 24. Originally seen by Dr. Myrla Halsted with epigastric pain, she had been treated for H. pylori with triple therapy because of this by primary care she had been on Carafate and Protonix.  She would take Ativan and that would allow her to swallow/eat better.  EGD in May 2017 with grade a esophagitis at GE junction.  Normal duodenal mucosa and stomach mucosa.  CT scanning abdominal ultrasound is negative.  2017 esophageal manometry and pH impedance study, fragmented peristalsis with large break seen 50% of the time.  Acid was controlled by Dexilant at all times.  There was a strong correlation between regurgitation symptoms and reflux.  ENT evaluation with flexible laryngoscopy was also negative in 2017. Celiac disease is controlled with diet, it was diagnosed by lab testing, she did not have a confirmatory endoscopic biopsy but follow-up biopsies and serologies have been negative. On [facility] evaluation the testing said that permissive genes were not present so celiac disease very unlikely   She saw the [facility] in December 2017 and they reviewed the work-up she had and it was thought she had an esophageal dysmotility disorder with some esophageal spasm.  Dr. Dub Mikes there thought she probably had some slow emptying of barium on an esophagram but a normally opening sphincter of the lower esophagus.  That was when she tried  diltiazem.  She was evaluated for autoimmune disorders.  Screening for Sjogren's/Lupus serologies and ANA CRP rheumatoid factor negative and the permissive genes for celiac disease were absent making celiac disease extremely unlikely.  She has also had a problem with rectal protrusion that she felt was prolapse.  She says a fist sized protrusion occurs and after several minutes it will spontaneously reduce.  This is only happened a few times, she was seen in January 2018 in [place] for this, and the PA thought that it was probably a hemorrhoid and she was treated for that.  Jackalyn says this is a really large protrusion, she does not have a picture of it but might of taken 1 in the past.  In between she seems to be okay she does not struggle with defecation to any great degree she does have a history of having gone to pelvic floor physical therapy when she  was a child because of bladder pains.  She drinks plenty of water she drinks kefir daily and takes probiotics.  She does note that before this will occur sometimes she will get a funny twinge of pain to the left of the umbilicus. No Known Allergies Current Meds  Medication Sig  . dexlansoprazole (DEXILANT) 60 MG capsule Take 60 mg by mouth daily.  Marland Kitchen ibuprofen (ADVIL,MOTRIN) 800 MG tablet Take 1 tablet (800 mg total) by mouth 3 (three) times daily.  Marland Kitchen levothyroxine (SYNTHROID, LEVOTHROID) 75 MCG tablet Take 1 tablet by mouth daily.  . Prenatal Vit-Fe Fumarate-FA (MULTIVITAMIN-PRENATAL) 27-0.8 MG TABS tablet Take 1 tablet by mouth daily at 12 noon.  . Probiotic Product (PROBIOTIC DAILY PO) Take by mouth daily.  . valACYclovir (VALTREX) 1000 MG tablet Take 1 tablet (1,000 mg total) by mouth 3 (three) times daily for 14 days.   Past Medical History:  Diagnosis Date  . Arthritis   . Asthma   . Celiac disease?    Permissive genes not present on [facility] testing  . Chronic kidney disease   . Esophageal dysmotility   . GERD (gastroesophageal reflux  disease)   . History of stomach ulcers   . Hypothyroidism   . Shingles    Past Surgical History:  Procedure Laterality Date  . ESOPHAGEAL MANOMETRY  2017  . ESOPHAGOGASTRODUODENOSCOPY  12/2015  . NASAL SEPTUM SURGERY    . NOSE REPAIR  2006  . 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   Review of Systems ? Shingles curently - left face Back pain Fatigue Arthritis sxs  All other ROS negative  Objective:   Physical Exam _0  (!) 84/60 (BP Location: Left Arm, Patient Position: Sitting, Cuff Size: Normal)   Pulse 80   Ht _1  (1.651 m) Comment: height measured without shoes  Wt 119 lb 6 oz (54.1 kg)   LMP 09/28/2018   BMI 19.87 kg/m @  General:  Well-developed, well-nourished and in no acute distress Eyes:  anicteric. ENT:   Mouth and posterior pharynx free of lesions.  Neck:   supple w/o thyromegaly or mass.  Lungs: Clear to auscultation bilaterally. Heart:  S1S2, no rubs, murmurs, gallops. Abdomen:  soft, non-tender, no hepatosplenomegaly, hernia, or mass and BS+.  Rectal:  Patti [place], [place] present for rectal exam and anoscopy  Anoderm inspection revealed no abnormalities Anal wink was present Digital exam revealed normal resting tone and voluntary squeeze. No mass but small rectocele present. Simulated defecation with valsalva revealed appropriate abdominal contraction and descent.   Anoscopy  Normal  Lymph:  no cervical or supraclavicular adenopathy. Extremities:   no edema, cyanosis or clubbing Skin  Erythema both cheeks ? Papules left. Neuro:  A&O x 3.  Psych:  appropriate mood and  Affect.   Data  Reviewed: See HPI

## 2018-10-02 ENCOUNTER — Ambulatory Visit: Payer: BLUE CROSS/BLUE SHIELD | Admitting: Internal Medicine

## 2018-10-02 ENCOUNTER — Encounter: Payer: Self-pay | Admitting: Internal Medicine

## 2018-10-02 VITALS — BP 94/62 | HR 88 | Temp 99.2°F | Wt 121.2 lb

## 2018-10-02 DIAGNOSIS — H66002 Acute suppurative otitis media without spontaneous rupture of ear drum, left ear: Secondary | ICD-10-CM

## 2018-10-02 MED ORDER — AMOXICILLIN-POT CLAVULANATE 875-125 MG PO TABS: 1.0000 | ORAL_TABLET | Freq: Two times a day (BID) | ORAL | 0 refills | Status: AC

## 2018-10-02 NOTE — Patient Instructions (Signed)
-  Hope you feel better soon!  -Start taking Augmentin 1 tablet twice daily for 7 days.  -Return in 7-10 days if no improvement.   Otitis Media, Adult  Otitis media means that the middle ear is red and swollen (inflamed) and full of fluid. The condition usually goes away on its own. Follow these instructions at home:  Take over-the-counter and prescription medicines only as told by your doctor.  If you were prescribed an antibiotic medicine, take it as told by your doctor. Do not stop taking the antibiotic even if you start to feel better.  Keep all follow-up visits as told by your doctor. This is important. Contact a doctor if:  You have bleeding from your nose.  There is a lump on your neck.  You are not getting better in 5 days.  You feel worse instead of better. Get help right away if:  You have pain that is not helped with medicine.  You have swelling, redness, or pain around your ear.  You get a stiff neck.  You cannot move part of your face (paralyzed).  You notice that the bone behind your ear hurts when you touch it.  You get a very bad headache. Summary  Otitis media means that the middle ear is red, swollen, and full of fluid.  This condition usually goes away on its own. In some cases, treatment may be needed.  If you were prescribed an antibiotic medicine, take it as told by your doctor. This information is not intended to replace advice given to you by your health care provider. Make sure you discuss any questions you have with your health care provider. Document Released: 01/22/2008 Document Revised: 08/26/2016 Document Reviewed: 08/26/2016 Elsevier Interactive Patient Education  2019 [employer].

## 2018-10-02 NOTE — Progress Notes (Signed)
Acute Office Visit     CC/Reason for Visit: Left jaw pain  HPI: Kristine Taylor is a 28 y.o. female who is coming in today for the above mentioned reasons.  On Sunday morning, 5 days ago, she woke up with pain right in front of her left ear, she thought she might have noticed a lump in her left upper neck but is not sure and it has now resolved.  She thought it felt like the pain she had had before when she had shingles around her rib cage so she went to urgent care.  Urgent care gave her a prescription for Valtrex one thousand milligrams 3 times a day for 14 days which she is still taking in addition to prescription strength ibuprofen.  Pain has not improved so she is here today for second opinion.  She has not had ear pain in of itself, but significant pain right in front of her ear, has had low-grade temperatures of around 99-100 despite taking ibuprofen around-the-clock, she does not have pain with chewing although she does notice that it can be uncomfortable, no sick contacts, no recent travel, no difficulty swallowing.  She has not had a rash or vesicles onn her left face.  Has not had any other URI symptoms.  No facial paralysis, no hearing loss, no visual disturbances.   Past Medical/Surgical History: Past Medical History:  Diagnosis Date  . Arthritis   . Asthma   . Celiac disease   . Chronic kidney disease   . GERD (gastroesophageal reflux disease)   . History of stomach ulcers   . Hypothyroidism   . Shingles     Past Surgical History:  Procedure Laterality Date  . ESOPHAGOGASTRODUODENOSCOPY    . NASAL SEPTUM SURGERY    . NOSE REPAIR  2006    Social History:  reports that she has never smoked. She has never used smokeless tobacco. She reports current alcohol use. She reports that she does not use drugs.  Allergies: No Known Allergies  Family History:  Family History  Problem Relation Age of Onset  . Graves' disease Mother   . Colon polyps Mother   .  Hyperlipidemia Father   . Hypertension Father   . Healthy Sister   . Graves' disease Maternal Grandfather      Current Outpatient Medications:  .  dexlansoprazole (DEXILANT) 60 MG capsule, Take 60 mg by mouth daily., Disp: , Rfl:  .  famotidine (PEPCID) 20 MG tablet, Take 1 tablet (20 mg total) by mouth 2 (two) times daily., Disp: 180 tablet, Rfl: 3 .  ibuprofen (ADVIL,MOTRIN) 800 MG tablet, Take 1 tablet (800 mg total) by mouth 3 (three) times daily., Disp: 21 tablet, Rfl: 0 .  levothyroxine (SYNTHROID, LEVOTHROID) 75 MCG tablet, Take 1 tablet by mouth daily., Disp: , Rfl:  .  Prenatal Vit-Fe Fumarate-FA (MULTIVITAMIN-PRENATAL) 27-0.8 MG TABS tablet, Take 1 tablet by mouth daily at 12 noon., Disp: , Rfl:  .  Probiotic Product (PROBIOTIC DAILY PO), Take by mouth daily., Disp: , Rfl:  .  valACYclovir (VALTREX) 1000 MG tablet, Take 1 tablet (1,000 mg total) by mouth 3 (three) times daily for 14 days., Disp: 42 tablet, Rfl: 0 .  amoxicillin-clavulanate (AUGMENTIN) 875-125 MG tablet, Take 1 tablet by mouth 2 (two) times daily for 7 days., Disp: 14 tablet, Rfl: 0  Review of Systems:  Constitutional: Denies fever, chills, diaphoresis, appetite change and fatigue.  HEENT: Denies photophobia, eye pain, redness, hearing loss, ear pain,  congestion, sore throat, rhinorrhea, sneezing, mouth sores, trouble swallowing, neck pain, neck stiffness and tinnitus.   Respiratory: Denies SOB, DOE, cough, chest tightness,  and wheezing.   Cardiovascular: Denies chest pain, palpitations and leg swelling.  Gastrointestinal: Denies nausea, vomiting, abdominal pain, diarrhea, constipation, blood in stool and abdominal distention.  Genitourinary: Denies dysuria, urgency, frequency, hematuria, flank pain and difficulty urinating.  Endocrine: Denies: hot or cold intolerance, sweats, changes in hair or nails, polyuria, polydipsia. Musculoskeletal: Denies myalgias, back pain, joint swelling, arthralgias and gait problem.    Skin: Denies pallor, rash and wound.  Neurological: Denies dizziness, seizures, syncope, weakness, light-headedness, numbness and headaches.  Hematological: Denies adenopathy. Easy bruising, personal or family bleeding history  Psychiatric/Behavioral: Denies suicidal ideation, mood changes, confusion, nervousness, sleep disturbance and agitation    Physical Exam: Vitals:   10/02/18 1334  BP: 94/62  Pulse: 88  Temp: 99.2 F (37.3 C)  TempSrc: Oral  SpO2: 96%  Weight: 121 lb 3.2 oz (55 kg)    Body mass index is 20.17 kg/m.   Constitutional: NAD, calm, comfortable Eyes: PERRL, lids and conjunctivae normal ENMT: Mucous membranes are moist. Posterior pharynx is erythematous but clear of any exudate or lesions. Normal dentition. Tympanic membrane is pearly white, no erythema or bulging on the right, on the left there is significant bulging, erythema and air-fluid levels. Neck: normal, supple, no masses, no thyromegaly, no palpable lymphadenopathy Respiratory: clear to auscultation bilaterally, no wheezing, no crackles. Normal respiratory effort. No accessory muscle use.  Cardiovascular: Regular rate and rhythm, no murmurs / rubs / gallops. No extremity edema. 2+ pedal pulses. No carotid bruits.  Psychiatric: Normal judgment and insight. Alert and oriented x 3. Normal mood.    Impression and Plan:  Non-recurrent acute suppurative otitis media of left ear without spontaneous rupture of tympanic membrane   -Based on findings on exam, I believe she has an acute otitis media of the left ear. Given air-fluid levels, low-grade temperature despite high-dose ibuprofen, will treat as bacterial with Augmentin twice daily for 7 days.  She tells me when she went to urgent care they did not look at her ears. -Have advised she complete course of Valtrex, although I doubt this is shingles at this point as she has not had eruption of rash/vesicles. -Advised return to clinic in 7 to 10 days if not  significantly improved.    Patient Instructions  -Hope you feel better soon!  -Start taking Augmentin 1 tablet twice daily for 7 days.  -Return in 7-10 days if no improvement.   Otitis Media, Adult  Otitis media means that the middle ear is red and swollen (inflamed) and full of fluid. The condition usually goes away on its own. Follow these instructions at home:  Take over-the-counter and prescription medicines only as told by your doctor.  If you were prescribed an antibiotic medicine, take it as told by your doctor. Do not stop taking the antibiotic even if you start to feel better.  Keep all follow-up visits as told by your doctor. This is important. Contact a doctor if:  You have bleeding from your nose.  There is a lump on your neck.  You are not getting better in 5 days.  You feel worse instead of better. Get help right away if:  You have pain that is not helped with medicine.  You have swelling, redness, or pain around your ear.  You get a stiff neck.  You cannot move part of your face (paralyzed).  You notice that the bone behind your ear hurts when you touch it.  You get a very bad headache. Summary  Otitis media means that the middle ear is red, swollen, and full of fluid.  This condition usually goes away on its own. In some cases, treatment may be needed.  If you were prescribed an antibiotic medicine, take it as told by your doctor. This information is not intended to replace advice given to you by your health care provider. Make sure you discuss any questions you have with your health care provider. Document Released: 01/22/2008 Document Revised: 08/26/2016 Document Reviewed: 08/26/2016 Elsevier Interactive Patient Education  2019 [place], MD [facility]

## 2018-10-07 ENCOUNTER — Ambulatory Visit: Payer: BLUE CROSS/BLUE SHIELD | Admitting: Family Medicine

## 2018-10-07 ENCOUNTER — Ambulatory Visit: Payer: Self-pay

## 2018-10-07 ENCOUNTER — Encounter: Payer: Self-pay | Admitting: Family Medicine

## 2018-10-07 VITALS — BP 86/60 | HR 79 | Temp 97.8°F | Wt 121.2 lb

## 2018-10-07 DIAGNOSIS — R42 Dizziness and giddiness: Secondary | ICD-10-CM

## 2018-10-07 DIAGNOSIS — R631 Polydipsia: Secondary | ICD-10-CM | POA: Diagnosis not present

## 2018-10-07 LAB — CBC WITH DIFFERENTIAL/PLATELET
Basophils Absolute: 0 10*3/uL (ref 0.0–0.1)
Basophils Relative: 0.7 % (ref 0.0–3.0)
Eosinophils Absolute: 0.1 10*3/uL (ref 0.0–0.7)
Eosinophils Relative: 1.8 % (ref 0.0–5.0)
HCT: 42.6 % (ref 36.0–46.0)
Hemoglobin: 14.3 g/dL (ref 12.0–15.0)
LYMPHS ABS: 2.3 10*3/uL (ref 0.7–4.0)
Lymphocytes Relative: 34.8 % (ref 12.0–46.0)
MCHC: 33.7 g/dL (ref 30.0–36.0)
MCV: 91 fl (ref 78.0–100.0)
MONOS PCT: 7.9 % (ref 3.0–12.0)
Monocytes Absolute: 0.5 10*3/uL (ref 0.1–1.0)
NEUTROS ABS: 3.6 10*3/uL (ref 1.4–7.7)
NEUTROS PCT: 54.8 % (ref 43.0–77.0)
PLATELETS: 262 10*3/uL (ref 150.0–400.0)
RBC: 4.68 Mil/uL (ref 3.87–5.11)
RDW: 13.5 % (ref 11.5–15.5)
WBC: 6.6 10*3/uL (ref 4.0–10.5)

## 2018-10-07 LAB — COMPREHENSIVE METABOLIC PANEL
ALT: 25 U/L (ref 0–35)
AST: 24 U/L (ref 0–37)
Albumin: 4.6 g/dL (ref 3.5–5.2)
Alkaline Phosphatase: 57 U/L (ref 39–117)
BUN: 11 mg/dL (ref 6–23)
CO2: 29 meq/L (ref 19–32)
Calcium: 9.7 mg/dL (ref 8.4–10.5)
Chloride: 104 mEq/L (ref 96–112)
Creatinine, Ser: 0.64 mg/dL (ref 0.40–1.20)
GFR: 110.28 mL/min (ref >60.00)
GLUCOSE: 87 mg/dL (ref 70–99)
POTASSIUM: 4.7 meq/L (ref 3.5–5.1)
Sodium: 141 mEq/L (ref 135–145)
Total Bilirubin: 0.5 mg/dL (ref 0.2–1.2)
Total Protein: 7.2 g/dL (ref 6.0–8.3)

## 2018-10-07 LAB — URINALYSIS
BILIRUBIN URINE: NEGATIVE
HGB URINE DIPSTICK: NEGATIVE
Ketones, ur: NEGATIVE
Leukocytes,Ua: NEGATIVE
NITRITE: NEGATIVE
Specific Gravity, Urine: 1.01 (ref 1.000–1.030)
TOTAL PROTEIN, URINE-UPE24: NEGATIVE
URINE GLUCOSE: NEGATIVE
Urobilinogen, UA: 0.2 (ref 0.0–1.0)
pH: 7.5 (ref 5.0–8.0)

## 2018-10-07 NOTE — Progress Notes (Signed)
Kristine Taylor DOB: Feb 03, 1991 Encounter date: 10/07/2018  This is a 28 y.o. female who presents with Chief Complaint  Patient presents with  . Dizziness    lightheaded, dizzy, BP at home 87/52 approx. 30 mins ago    History of present illness:  Has been really dizzy, lightheaded for a couple of days. Pain in ear has gone away, but dizziness and headache have not gone away. Blood pressure is low. More off balance than room spinning.   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   Feels really thirsty, but drinking a lot of water.    Ended up at [facility] clinic for esoph spasm. Did barium swallow; 24 hr ph testing, upper GI. Did not have cardiology evaluation at that time.   Sees reproductive endo; had change in synthroid dose last week (increased 31mg for TSH between 4-5)  No Known Allergies Current Meds  Medication Sig  . amoxicillin-clavulanate (AUGMENTIN) 875-125 MG tablet Take 1 tablet by mouth 2 (two) times daily for 7 days.  .Marland Kitchendexlansoprazole (DEXILANT) 60 MG capsule Take 60 mg by mouth daily.  . famotidine (PEPCID) 20 MG tablet Take 1 tablet (20 mg total) by mouth 2 (two) times daily.  .Marland Kitchenibuprofen (ADVIL,MOTRIN) 800 MG tablet Take 1 tablet (800 mg total) by mouth 3 (three) times daily.  .Marland Kitchenlevothyroxine (SYNTHROID, LEVOTHROID) 75 MCG tablet Take 1 tablet by mouth daily.  . Prenatal Vit-Fe Fumarate-FA (MULTIVITAMIN-PRENATAL) 27-0.8 MG TABS tablet Take 1 tablet by mouth daily at 12 noon.  . Probiotic Product (PROBIOTIC DAILY PO) Take by mouth daily.  . valACYclovir (VALTREX) 1000 MG tablet Take 1 tablet (1,000 mg total) by mouth 3 (three) times daily for 14 days.    Review of Systems  Constitutional: Negative for chills, fatigue and fever.  HENT: Negative for congestion, ear pain and sinus pressure.   Respiratory: Negative for cough, chest tightness, shortness of breath and wheezing.   Cardiovascular:  Negative for chest pain (with esoph spasm; fairly consistent. Not currently on treatment), palpitations and leg swelling.  Gastrointestinal: Negative for abdominal pain, constipation, diarrhea and vomiting.  Neurological: Positive for light-headedness. Negative for weakness, numbness and headaches.    Objective:  BP (!) 86/60 (BP Location: Left Arm, Patient Position: Supine, Cuff Size: Normal)   Pulse 79   Temp 97.8 F (36.6 C) (Oral)   Wt 121 lb 3.2 oz (55 kg)   LMP 09/28/2018   SpO2 98%   BMI 20.17 kg/m   Weight: 121 lb 3.2 oz (55 kg)   BP Readings from Last 3 Encounters:  10/07/18 (!) 86/60  10/02/18 94/62  10/01/18 (!) 84/60   Wt Readings from Last 3 Encounters:  10/07/18 121 lb 3.2 oz (55 kg)  10/02/18 121 lb 3.2 oz (55 kg)  10/01/18 119 lb 6 oz (54.1 kg)    Physical Exam Constitutional:      General: She is not in acute distress.    Appearance: She is well-developed.  HENT:     Right Ear: Tympanic membrane, ear canal and external ear normal. Tympanic membrane is not scarred, perforated or erythematous.     Left Ear: Tympanic membrane, ear canal and external ear normal. Tympanic membrane is not injected, scarred, perforated, erythematous or bulging.  Eyes:     Pupils: Pupils are equal, round, and reactive to light.     Comments: No eyelashes  Cardiovascular:     Rate and Rhythm:  Normal rate and regular rhythm.     Heart sounds: Normal heart sounds. No murmur. No friction rub.  Pulmonary:     Effort: Pulmonary effort is normal. No respiratory distress.     Breath sounds: Normal breath sounds. No wheezing or rales.  Abdominal:     General: Abdomen is flat. Bowel sounds are normal.     Palpations: Abdomen is soft.     Tenderness: There is no abdominal tenderness.  Musculoskeletal:     Right lower leg: No edema.     Left lower leg: No edema.  Neurological:     Mental Status: She is alert and oriented to person, place, and time.  Psychiatric:        Behavior:  Behavior normal.     Assessment/Plan 1. Light headedness Not officially orthostatic (pulse per MA remained stable with position changes) but bp is overall low and she may sense those subtle drops a little more due to this. Encouraged keeping up with fluids, taking time with position changes. Sick symptoms have improved. Not vertigo type sx. Uncertain if related to new medication OR new change in synthroid dosing (sees endocrinology and had synthroid increased by 97mg last week). Will get some baseline bloodwork and consider further eval pending this. - CBC with Differential/Platelet; Future - Comprehensive metabolic panel; Future - Urinalysis - Comprehensive metabolic panel - CBC with Differential/Platelet  2. Increased thirst - Urinalysis  Return pending bloodwork.     JMicheline Rough MD

## 2018-10-07 NOTE — Telephone Encounter (Signed)
Noted  

## 2018-10-07 NOTE — Telephone Encounter (Signed)
Pt called to say her BP has been running low.  Today 87/52 HR 71 and while on the phone with me 95/61 HR 78.  She states that last Friday 2/14 she was seen for an ear infection and is treating with Augmentin.  She states that she has a headache and dizziness as symptoms and is concerned that the low BP may be the cause.  She also states that her prolactin level is high.  She is working with a reproductive specialist. Garen Grams gave permission for patient to be seen in office today instead of ED. Pt BP at last office visit (94/62). And C/O dizziness with DX of ear infection. Care advice read to patient. Pt verbalized understanding of all instructions. Reason for Disposition . [9] Fall in systolic BP > 20 mm Hg from normal AND [2] dizzy, lightheaded, or weak  Answer Assessment - Initial Assessment Questions 1. BLOOD PRESSURE: "What is the blood pressure?" "Did you take at least two measurements 5 minutes apart?"     87/52 HR 71, 95/61 78 2. ONSET: "When did you take your blood pressure?"     today 3. HOW: "How did you obtain the blood pressure?" (e.g., visiting nurse, automatic home BP monitor)     Home monitor 4. HISTORY: "Do you have a history of low blood pressure?" "What is your blood pressure normally?"     No 5. MEDICATIONS: "Are you taking any medications for blood pressure?" If yes: "Have they been changed recently?"     don't take BP medication. Increase in levothyroxine 6. PULSE RATE: "Do you know what your pulse rate is?"     71 7. OTHER SYMPTOMS: "Have you been sick recently?" "Have you had a recent injury?"     Dizziness treated for ear infection 10/02/17 Augmentin 8. PREGNANCY: "Is there any chance you are pregnant?" "When was your last menstrual period?"     No Reproductive specialist  Protocols used: LOW BLOOD PRESSURE-A-AH

## 2018-10-12 ENCOUNTER — Encounter: Payer: Self-pay | Admitting: Family Medicine

## 2018-10-12 ENCOUNTER — Ambulatory Visit: Payer: BLUE CROSS/BLUE SHIELD | Admitting: Family Medicine

## 2018-10-12 ENCOUNTER — Ambulatory Visit: Payer: Self-pay | Admitting: *Deleted

## 2018-10-12 VITALS — Temp 97.7°F | Wt 122.0 lb

## 2018-10-12 DIAGNOSIS — R42 Dizziness and giddiness: Secondary | ICD-10-CM | POA: Diagnosis not present

## 2018-10-12 LAB — POCT URINALYSIS DIPSTICK
BILIRUBIN UA: NEGATIVE
Blood, UA: NEGATIVE
Glucose, UA: NEGATIVE
KETONES UA: NEGATIVE
Leukocytes, UA: NEGATIVE
Nitrite, UA: NEGATIVE
PH UA: 6.5 (ref 5.0–8.0)
Protein, UA: NEGATIVE
Spec Grav, UA: 1.01 (ref 1.010–1.025)
UROBILINOGEN UA: 0.2 U/dL

## 2018-10-12 LAB — POCT URINE PREGNANCY: Preg Test, Ur: NEGATIVE

## 2018-10-12 MED ORDER — FLUTICASONE PROPIONATE 50 MCG/ACT NA SUSP: 1.0000 | Freq: Every day | NASAL | 0 refills | Status: DC

## 2018-10-12 MED ORDER — MECLIZINE HCL 25 MG PO TABS: 25.0000 mg | ORAL_TABLET | Freq: Three times a day (TID) | ORAL | 0 refills | Status: DC | PRN

## 2018-10-12 NOTE — Patient Instructions (Signed)
Dizziness Dizziness is a common problem. It is a feeling of unsteadiness or light-headedness. You may feel like you are about to faint. Dizziness can lead to injury if you stumble or fall. Anyone can become dizzy, but dizziness is more common in older adults. This condition can be caused by a number of things, including medicines, dehydration, or illness. Follow these instructions at home: Eating and drinking  Drink enough fluid to keep your urine clear or pale yellow. This helps to keep you from becoming dehydrated. Try to drink more clear fluids, such as water.  Do not drink alcohol.  Limit your caffeine intake if told to do so by your health care provider. Check ingredients and nutrition facts to see if a food or beverage contains caffeine.  Limit your salt (sodium) intake if told to do so by your health care provider. Check ingredients and nutrition facts to see if a food or beverage contains sodium. Activity  Avoid making quick movements. ? Rise slowly from chairs and steady yourself until you feel okay. ? In the morning, first sit up on the side of the bed. When you feel okay, stand slowly while you hold onto something until you know that your balance is fine.  If you need to stand in one place for a long time, move your legs often. Tighten and relax the muscles in your legs while you are standing.  Do not drive or use heavy machinery if you feel dizzy.  Avoid bending down if you feel dizzy. Place items in your home so that they are easy for you to reach without leaning over. Lifestyle  Do not use any products that contain nicotine or tobacco, such as cigarettes and e-cigarettes. If you need help quitting, ask your health care provider.  Try to reduce your stress level by using methods such as yoga or meditation. Talk with your health care provider if you need help to manage your stress. General instructions  Watch your dizziness for any changes.  Take over-the-counter and  prescription medicines only as told by your health care provider. Talk with your health care provider if you think that your dizziness is caused by a medicine that you are taking.  Tell a friend or a family member that you are feeling dizzy. If he or she notices any changes in your behavior, have this person call your health care provider.  Keep all follow-up visits as told by your health care provider. This is important. Contact a health care provider if:  Your dizziness does not go away.  Your dizziness or light-headedness gets worse.  You feel nauseous.  You have reduced hearing.  You have new symptoms.  You are unsteady on your feet or you feel like the room is spinning. Get help right away if:  You vomit or have diarrhea and are unable to eat or drink anything.  You have problems talking, walking, swallowing, or using your arms, hands, or legs.  You feel generally weak.  You are not thinking clearly or you have trouble forming sentences. It may take a friend or family member to notice this.  You have chest pain, abdominal pain, shortness of breath, or sweating.  Your vision changes.  You have any bleeding.  You have a severe headache.  You have neck pain or a stiff neck.  You have a fever. These symptoms may represent a serious problem that is an emergency. Do not wait to see if the symptoms will go away. Get medical help  right away. Call your local emergency services (911 in the [place] Do not drive yourself to the hospital. Summary  Dizziness is a feeling of unsteadiness or light-headedness. This condition can be caused by a number of things, including medicines, dehydration, or illness.  Anyone can become dizzy, but dizziness is more common in older adults.  Drink enough fluid to keep your urine clear or pale yellow. Do not drink alcohol.  Avoid making quick movements if you feel dizzy. Monitor your dizziness for any changes. This information is not intended to  replace advice given to you by your health care provider. Make sure you discuss any questions you have with your health care provider. Document Released: 01/29/2001 Document Revised: 09/07/2016 Document Reviewed: 09/07/2016 Elsevier Interactive Patient Education  2019 [employer].

## 2018-10-12 NOTE — Progress Notes (Signed)
Subjective:    Patient ID: Kristine Taylor, female    DOB: August 27, 1990, 28 y.o.   MRN: [id number]  No chief complaint on file.   HPI Patient was seen today for ongoing concern.  Pt endorses continued dizziness x1 week.  Prior to being seen in the office, told L sided facial/jaw pain was 2/2 shingles at [facility].  Pt states they never looked in her ear.  Pt was seen 2/14 for continued L sided facial pain, dx'd with L otitis media,  prescribed augmentin.  Pt was then seen on 2/19 for lightheadedness and increased thirst.  Labs obtained were normal.  Pt noted to be hypotensive during each visit, but states even at home her bp is low.  Pt notes feeling like "inside of head is floating" rather than a dizziness.  Pt also notes a dull L sided HA, pressure in L forehead, and nausesa.  Pt denies feeling like the room is spinning, vomiting, or fever.  Past Medical History:  Diagnosis Date  . Arthritis   . Asthma   . Celiac disease?    Permissive genes not present on [facility] testing  . Chronic kidney disease   . Esophageal dysmotility   . GERD (gastroesophageal reflux disease)   . History of stomach ulcers   . Hypothyroidism   . Shingles     No Known Allergies  ROS General: Denies fever, chills, night sweats, changes in weight, changes in appetite  +head "floating" HEENT: Denies ear pain, changes in vision, rhinorrhea, sore throat  +L sided HA, forehead pressure CV: Denies CP, palpitations, SOB, orthopnea Pulm: Denies SOB, cough, wheezing GI: Denies abdominal pain, nausea, vomiting, diarrhea, constipation GU: Denies dysuria, hematuria, frequency, vaginal discharge Msk: Denies muscle cramps, joint pains Neuro: Denies weakness, numbness, tingling Skin: Denies rashes, bruising Psych: Denies depression, anxiety, hallucinations      Objective:    Temperature 97.7 F (36.5 C), temperature source Oral, weight 122 lb (55.3 kg), last menstrual period 09/28/2018, SpO2 98 %.  Gen. Pleasant,  well-nourished, in no distress, normal affect   HEENT: [place]/AT, face symmetric, conjunctiva clear, no scleral icterus, PERRLA, EOMI, no nystagmus, nares patent without drainage, pharynx without erythema or exudate. TMs full b/l, no erythema or purulence. Neck: No JVD, no thyromegaly no carotid bruits Lungs: no accessory muscle use, CTAB, no wheezes or rales Cardiovascular: RRR, no m/r/g, no peripheral edema Neuro:  A&Ox3, CN II-XII intact, normal gait Skin:  Warm, no lesions/ rash   Wt Readings from Last 3 Encounters:  10/12/18 122 lb (55.3 kg)  10/07/18 121 lb 3.2 oz (55 kg)  10/02/18 121 lb 3.2 oz (55 kg)    Lab Results  Component Value Date   WBC 6.6 10/07/2018   HGB 14.3 10/07/2018   HCT 42.6 10/07/2018   PLT 262.0 10/07/2018   GLUCOSE 87 10/07/2018   ALT 25 10/07/2018   AST 24 10/07/2018   NA 141 10/07/2018   K 4.7 10/07/2018   CL 104 10/07/2018   CREATININE 0.64 10/07/2018   BUN 11 10/07/2018   CO2 29 10/07/2018   TSH 2.40 03/30/2010    Assessment/Plan:  Dizziness  -ongoing -discussed various causes including medications, orthostaic hypotension, vertigo, dehydration, etc -Orthostatic BPs normal -UA with SG 1.010.  UHcg negative -discussed concern for possible pituitary or other brain lesion given recent elevated prolactin level and new onset HA.  Pt to have prolactin rechecked on Wednesday.  Discussed if remains elevated imaging is warranted.  If not elevated, but HA  and feeling in head continue imaging may still be needed. - Plan: POCT urinalysis dipstick, meclizine (ANTIVERT) 25 MG tablet, fluticasone (FLONASE) 50 MCG/ACT nasal spray  F/u prn in the next wk, sooner if needed  Grier Mitts, MD

## 2018-10-12 NOTE — Telephone Encounter (Signed)
Patient is calling to report she is still having dizziness and headache- she was told to wait to until her ear ache was gone and she had finished her treatment.patient is off antibiotics now.  The headache and dizziness is still present.  Reason for Disposition . [1] MODERATE dizziness (e.g., interferes with normal activities) AND [2] has NOT been evaluated by physician for this  (Exception: dizziness caused by heat exposure, sudden standing, or poor fluid intake)  Answer Assessment - Initial Assessment Questions 1. DESCRIPTION: "Describe your dizziness."     Feels "drunk" 2. LIGHTHEADED: "Do you feel lightheaded?" (e.g., somewhat faint, woozy, weak upon standing)     woozy 3. VERTIGO: "Do you feel like either you or the room is spinning or tilting?" (i.e. vertigo)     Feels like she is spinning 4. SEVERITY: "How bad is it?"  "Do you feel like you are going to faint?" "Can you stand and walk?"   - MILD - walking normally   - MODERATE - interferes with normal activities (e.g., work, school)    - SEVERE - unable to stand, requires support to walk, feels like passing out now.      Mild/moderate 5. ONSET:  "When did the dizziness begin?"     Started 19 days ago- got worse last week 6. AGGRAVATING FACTORS: "Does anything make it worse?" (e.g., standing, change in head position)     Better when laying down- a lot of activity- moving fast 7. HEART RATE: "Can you tell me your heart rate?" "How many beats in 15 seconds?"  (Note: not all patients can do this)       Patient feels her heart rate is normal 8. CAUSE: "What do you think is causing the dizziness?"     Thought to be associated with ear infection 9. RECURRENT SYMPTOM: "Have you had dizziness before?" If so, ask: "When was the last time?" "What happened that time?"     Not really- patient has had concussion before and this feels kind of like that 10. OTHER SYMPTOMS: "Do you have any other symptoms?" (e.g., fever, chest pain, vomiting,  diarrhea, bleeding)       Pressure in left side of head- fullness- causing headache- consistent with dizziness- may get worse with dizziness 11. PREGNANCY: "Is there any chance you are pregnant?" "When was your last menstrual period?"       No- LMP- 09/27/18  Protocols used: Ned Grace

## 2018-10-14 DIAGNOSIS — E221 Hyperprolactinemia: Secondary | ICD-10-CM | POA: Diagnosis not present

## 2018-10-14 DIAGNOSIS — N912 Amenorrhea, unspecified: Secondary | ICD-10-CM | POA: Diagnosis not present

## 2018-10-30 DIAGNOSIS — N912 Amenorrhea, unspecified: Secondary | ICD-10-CM | POA: Diagnosis not present

## 2018-10-30 DIAGNOSIS — E039 Hypothyroidism, unspecified: Secondary | ICD-10-CM | POA: Diagnosis not present

## 2018-10-30 DIAGNOSIS — Z319 Encounter for procreative management, unspecified: Secondary | ICD-10-CM | POA: Diagnosis not present

## 2018-10-30 DIAGNOSIS — E221 Hyperprolactinemia: Secondary | ICD-10-CM | POA: Diagnosis not present

## 2018-11-06 ENCOUNTER — Other Ambulatory Visit: Payer: Self-pay

## 2018-11-06 ENCOUNTER — Ambulatory Visit (INDEPENDENT_AMBULATORY_CARE_PROVIDER_SITE_OTHER): Payer: BLUE CROSS/BLUE SHIELD | Admitting: Family Medicine

## 2018-11-06 ENCOUNTER — Telehealth: Payer: Self-pay | Admitting: Family Medicine

## 2018-11-06 ENCOUNTER — Encounter: Payer: Self-pay | Admitting: Family Medicine

## 2018-11-06 VITALS — BP 104/64 | HR 72

## 2018-11-06 DIAGNOSIS — R1013 Epigastric pain: Secondary | ICD-10-CM | POA: Diagnosis not present

## 2018-11-06 MED ORDER — PROMETHAZINE HCL 12.5 MG PO TABS: 6.2500 mg | ORAL_TABLET | Freq: Three times a day (TID) | ORAL | 0 refills | Status: DC | PRN

## 2018-11-06 MED ORDER — SUCRALFATE 1 G PO TABS
1.0000 g | ORAL_TABLET | Freq: Three times a day (TID) | ORAL | 1 refills | Status: DC
Start: 2018-11-06 — End: 2018-11-09

## 2018-11-06 NOTE — Telephone Encounter (Signed)
Questions for Screening COVID-19  Symptom onset: Started 3/11-3/12 Fever onset 10/30/18 Nausea, loss of appetite Night sweats - felt feverish Hoarseness and sore throat Very mild cough --pt has history of esoph spasms, ulcers and reflux **feels her sx's are some related to these dx's** CP and pain between her shoulder blades -- feel poss related to reflux??   No SOB, fever, HA or V/D  Travel or Contacts:  Traveled to [place], [place] about 14 days ago - came home 10 days ago No contact with anyone sick  During this illness, did/does the patient experience any of the following symptoms? Fever >100.41F []   Yes [x]   No []   Unknown Subjective fever (felt feverish) [x]   Yes []   No []   Unknown Chills []   Yes [x]   No []   Unknown Muscle aches (myalgia) []   Yes [x]   No []   Unknown Runny nose (rhinorrhea) []   Yes [x]   No []   Unknown Sore throat [x]   Yes []   No []   Unknown Cough (new onset or worsening of chronic cough) [x]   Yes []   No []   Unknown Shortness of breath (dyspnea) [x]   Yes []   No []   Unknown Nausea or vomiting [x]   Yes []   No []   Unknown Headache []   Yes [x]   No []   Unknown Abdominal pain  []   Yes [x]   No []   Unknown Diarrhea (?3 loose/looser than normal stools/24hr period) []   Yes [x]   No []   Unknown Other, specify:  Patient risk factors: Smoker? []   Current []   Former [x]   Never If female, currently pregnant? []   Yes [x]   No  Patient Active Problem List   Diagnosis Date Noted  . H/O hemorrhoids 09/04/2018  . Celiac disease 09/04/2018  . Constipation 09/04/2018  . Esophageal spasm 09/04/2018  . Acquired hypothyroidism 09/04/2018  . Arthritis 09/04/2018  . SACROILIAC JOINT DYSFUNCTION 07/10/2010  . 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 SPRAIN AND STRAIN OF LUMBOSACRAL 07/10/2010  . 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 ASTHMA 03/30/2010  . SHINGLES, HX OF 03/30/2010    Plan:  []   High risk for COVID-19 with red flags go to ED (with CP, SOB, weak/lightheaded, or  fever > 101.5). Call ahead.  [x]   High risk for COVID-19 but stable will have car visit. Inform provider and coordinate time. Will be completed in afternoon. *Consider myChart, telephone, or WebEx visit if available at site. []   No red flags but URI signs or symptoms will go through side door and be seen in dedicated room.  Note: Referral to telemedicine is an appropriate alternative disposition for higher risk but stable. [facility]/e-Visit: [phone].

## 2018-11-06 NOTE — Progress Notes (Signed)
Kristine Taylor DOB: 28-Mar-1991 Encounter date: 11/06/2018  This is a 28 y.o. female who presents with Chief Complaint  Patient presents with  . URI    decreased appetite and nausea x 4 days.   sorethroat that she feels that she has to clear all the time.  bad night sweats but denies any fever or chills.  visisted [place]  10 days ago with a large group of people.      History of present illness:  Overall patient states she feels like she did when she had a gastric ulcer in the past and was diagnosed with H. pylori.  Constant sensation of nausea, but no vomiting.  Her bowel movements have been normal.  She has been eating very bland diet, which she is accustomed to with her recurrent GI symptoms.  She has been taking her Dexilant and Pepcid regularly as directed by her GI specialist.  Some slight epigastric discomfort, but not significantly worse than her typical.  He has a slight discomfort in her throat, but this is not severe.  Denies other upper resp symptoms.  Denies cough.  Denies shortness of breath.    No Known Allergies No outpatient medications have been marked as taking for the 11/06/18 encounter (Office Visit) with Caren Macadam, MD.    Review of Systems  Constitutional: Negative for chills and fever.  HENT: Positive for congestion and postnasal drip (possibly).   Respiratory: Negative for cough, shortness of breath and wheezing.   Gastrointestinal: Positive for nausea. Negative for abdominal distention, blood in stool, constipation, diarrhea and vomiting.  Genitourinary: Negative for difficulty urinating.       No chance of pregnancy. She is seeing fertility specialist and she had an ultrasound last week    Objective:  BP 104/64 (BP Location: Left Arm, Patient Position: Sitting, Cuff Size: Normal)   Pulse 72   SpO2 98%       BP Readings from Last 3 Encounters:  11/06/18 104/64  10/07/18 (!) 86/60  10/02/18 94/62   Wt Readings from Last 3  Encounters:  10/12/18 122 lb (55.3 kg)  10/07/18 121 lb 3.2 oz (55 kg)  10/02/18 121 lb 3.2 oz (55 kg)    Physical Exam Constitutional:      General: She is not in acute distress.    Appearance: She is well-developed. She is not ill-appearing, toxic-appearing or diaphoretic.  HENT:     Mouth/Throat:     Mouth: Mucous membranes are moist.     Tongue: No lesions.     Pharynx: Oropharynx is clear. Posterior oropharyngeal erythema (mild) present. No oropharyngeal exudate.  Eyes:     Conjunctiva/sclera: Conjunctivae normal.     Pupils: Pupils are equal, round, and reactive to light.  Neck:     Musculoskeletal: Normal range of motion and neck supple. No muscular tenderness.  Cardiovascular:     Rate and Rhythm: Normal rate and regular rhythm.     Heart sounds: Normal heart sounds. No murmur. No friction rub.  Pulmonary:     Effort: Pulmonary effort is normal. No respiratory distress.     Breath sounds: Normal breath sounds. No wheezing or rales.  Musculoskeletal:     Right lower leg: No edema.     Left lower leg: No edema.  Lymphadenopathy:     Cervical: No cervical adenopathy.  Neurological:     Mental Status: She is alert and oriented to person, place, and time.  Psychiatric:  Behavior: Behavior normal.     Assessment/Plan 1. Epigastric discomfort Patient was interested in having H. pylori testing done, but we discussed limitations of this study.  He has not had significant relief in the past with abdominal symptoms trying sacral fate, but she is willing to try again.  Additionally she is willing to try Phenergan to help with nausea, although she gets very sedated with this.  Zofran causes a headache for her.  I have advised her to try the Carafate as directed, and take the Phenergan half to full tablet as needed for nausea.  I have asked her to message me if any worsening of symptoms over the weekend.  We discussed treating empirically for H. pylori infection and keeping  her out of the health system if possible due to risk of coronavirus at present time.  Additionally, she could reach out to her GI specialist for further advice if needed.    Return if symptoms worsen or fail to improve.     Micheline Rough, MD

## 2018-11-09 ENCOUNTER — Other Ambulatory Visit: Payer: Self-pay | Admitting: Family Medicine

## 2018-11-09 MED ORDER — ONDANSETRON HCL 4 MG PO TABS: 4.0000 mg | ORAL_TABLET | Freq: Three times a day (TID) | ORAL | 0 refills | Status: DC | PRN

## 2018-11-10 ENCOUNTER — Telehealth: Payer: Self-pay | Admitting: Internal Medicine

## 2018-11-10 NOTE — Telephone Encounter (Signed)
Pt called would to know if she can speak to Dr. Carlean Purl. She is currently having some abd pain, loss of apatite, and nausea for the pass week or two.

## 2018-11-10 NOTE — Telephone Encounter (Signed)
Patient scheduled for Webex visit on Friday with Dr. Carlean Purl

## 2018-11-12 DIAGNOSIS — N912 Amenorrhea, unspecified: Secondary | ICD-10-CM | POA: Diagnosis not present

## 2018-11-12 DIAGNOSIS — E039 Hypothyroidism, unspecified: Secondary | ICD-10-CM | POA: Diagnosis not present

## 2018-11-12 DIAGNOSIS — E221 Hyperprolactinemia: Secondary | ICD-10-CM | POA: Diagnosis not present

## 2018-11-13 ENCOUNTER — Telehealth (INDEPENDENT_AMBULATORY_CARE_PROVIDER_SITE_OTHER): Payer: BLUE CROSS/BLUE SHIELD | Admitting: Internal Medicine

## 2018-11-13 ENCOUNTER — Other Ambulatory Visit: Payer: Self-pay

## 2018-11-13 ENCOUNTER — Other Ambulatory Visit (INDEPENDENT_AMBULATORY_CARE_PROVIDER_SITE_OTHER): Payer: BLUE CROSS/BLUE SHIELD

## 2018-11-13 DIAGNOSIS — K5903 Drug induced constipation: Secondary | ICD-10-CM | POA: Diagnosis not present

## 2018-11-13 DIAGNOSIS — R110 Nausea: Secondary | ICD-10-CM

## 2018-11-13 DIAGNOSIS — R630 Anorexia: Secondary | ICD-10-CM

## 2018-11-13 DIAGNOSIS — R61 Generalized hyperhidrosis: Secondary | ICD-10-CM

## 2018-11-13 LAB — COMPREHENSIVE METABOLIC PANEL
ALK PHOS: 58 U/L (ref 39–117)
ALT: 15 U/L (ref 0–35)
AST: 16 U/L (ref 0–37)
Albumin: 4.6 g/dL (ref 3.5–5.2)
BILIRUBIN TOTAL: 0.7 mg/dL (ref 0.2–1.2)
BUN: 12 mg/dL (ref 6–23)
CO2: 28 mEq/L (ref 19–32)
Calcium: 9.7 mg/dL (ref 8.4–10.5)
Chloride: 104 mEq/L (ref 96–112)
Creatinine, Ser: 0.75 mg/dL (ref 0.40–1.20)
GFR: 91.77 mL/min (ref >60.00)
Glucose, Bld: 87 mg/dL (ref 70–99)
POTASSIUM: 4.8 meq/L (ref 3.5–5.1)
Sodium: 138 mEq/L (ref 135–145)
TOTAL PROTEIN: 7.4 g/dL (ref 6.0–8.3)

## 2018-11-13 LAB — CBC WITH DIFFERENTIAL/PLATELET
Basophils Absolute: 0 10*3/uL (ref 0.0–0.1)
Basophils Relative: 0.9 % (ref 0.0–3.0)
EOS ABS: 0 10*3/uL (ref 0.0–0.7)
Eosinophils Relative: 0.9 % (ref 0.0–5.0)
HEMATOCRIT: 41.7 % (ref 36.0–46.0)
HEMOGLOBIN: 14.3 g/dL (ref 12.0–15.0)
LYMPHS PCT: 43 % (ref 12.0–46.0)
Lymphs Abs: 2.4 10*3/uL (ref 0.7–4.0)
MCHC: 34.4 g/dL (ref 30.0–36.0)
MCV: 90.6 fl (ref 78.0–100.0)
MONO ABS: 0.6 10*3/uL (ref 0.1–1.0)
Monocytes Relative: 11.5 % (ref 3.0–12.0)
Neutro Abs: 2.4 10*3/uL (ref 1.4–7.7)
Neutrophils Relative %: 43.7 % (ref 43.0–77.0)
Platelets: 247 10*3/uL (ref 150.0–400.0)
RBC: 4.6 Mil/uL (ref 3.87–5.11)
RDW: 13.5 % (ref 11.5–15.5)
WBC: 5.5 10*3/uL (ref 4.0–10.5)

## 2018-11-13 LAB — C-REACTIVE PROTEIN: CRP: <1 mg/dL (ref 0.5–20.0)

## 2018-11-13 LAB — SEDIMENTATION RATE: SED RATE: 2 mm/h (ref 0–20)

## 2018-11-13 NOTE — Progress Notes (Signed)
Labs are all fine Will see what the [facility] does and let me knw.  Gatha Mayer, MD, [facility]

## 2018-11-13 NOTE — Progress Notes (Signed)
TELEHEALTH ENCOUNTER IN SETTING OF COVID-19 PANDEMIC - REQUESTED BY PATIENT SERVICE PROVIDED BY TELEMEDECINE - TYPE: Webex PATIENT LOCATION: Home PATIENT HAS CONSENTED TO TELEHEALTH VISIT PROVIDER LOCATION: OFFICE TIME SPENT ON CALL:25 mins    Kristine Taylor 28 y.o. 05-05-1991 [id number]  Assessment & Plan:   Encounter Diagnoses  Name Primary?  . Nausea without vomiting Yes  . Anorexia   . Night sweats   . Drug-induced constipation - suspected from ondansetron    Given that she has these night sweats we will go ahead and check a CBC CMET C-reactive protein and a sed rate.  Stop ondansetron  Trial of FD guard for her nausea and anorexia and dyspeptic symptoms  Further plans pending these results.  I cannot see where imaging or endoscopy is necessary to evaluate.  I appreciate the opportunity to care for this patient. CC: Kristine Ruddy, MD     Subjective:   Chief Complaint: nausea and anorexia  HPI Kristine Taylor was seen in mid February, first at an urgent care with pain that she thought might be like her shingles that she has had before, so she was prescribed Valtrex and she went to primary care and was seen by Dr. Jerilee Hoh who diagnosed her with otitis media and treated her with Augmentin.  She had some low-grade fevers.  She was seen again a couple of times in primary care with lightheadedness and dizziness.  Laboratory testing was CMET and CBC were normal.  On March 20 she presented with epigastric pain nausea and anorexia the pain was minimal.  Initially there was concern because she was having some night sweats that she might have COVID-19.  She was not tested after they screened her in more detail.  She is continued to have this problem with severe loss of appetite.  There is some nausea.  She was prescribed Zofran which she is taking.  Constipation then ensued and she is having difficulty with defecation having only a couple of small balls of stool twice in the  last week.  She does not have a scale but she does think she has lost some weight.  She says that many of the symptoms seem like when she had an ulcer before though she realizes that she is on Dexilant and Pepcid and it is unlikely that she has an ulcer.  Her Synthroid dose was increased from 50 to 75 mcg in the last couple of weeks.  She has had 5 or 6 episodes of severe drenching night sweats in the last 2 weeks.  She has not thermometer but has not used it but does not think she has had a fever.  I mentioned that stress and anxiety can precipitate symptoms like this but she does not feel like she is significantly stressed or anxious despite all the issues with the coronavirus.  Menses are somewhat irregular, she is seeing reproductive endocrinology, her Reglan that she was on for her esophageal problems was discontinued and her prolactin level is now come down into the normal range. No Known Allergies Current Meds  Medication Sig  . Caraway Oil-Levomenthol (FDGARD) 25-20.75 MG CAPS Take by mouth. As directed  . dexlansoprazole (DEXILANT) 60 MG capsule Take 60 mg by mouth daily.  . famotidine (PEPCID) 20 MG tablet Take 1 tablet (20 mg total) by mouth 2 (two) times daily.  Marland Kitchen levothyroxine (SYNTHROID, LEVOTHROID) 75 MCG tablet Take 1 tablet by mouth daily.  . ondansetron (ZOFRAN) 4 MG tablet Take 1 tablet (4  mg total) by mouth every 8 (eight) hours as needed for nausea or vomiting.  . Prenatal Vit-Fe Fumarate-FA (MULTIVITAMIN-PRENATAL) 27-0.8 MG TABS tablet Take 1 tablet by mouth daily at 12 noon.  . Probiotic Product (PROBIOTIC DAILY PO) Take by mouth daily.   Past Medical History:  Diagnosis Date  . Arthritis   . Asthma   . Celiac disease?    Permissive genes not present on [facility]  . Chronic kidney disease   . Esophageal dysmotility   . GERD (gastroesophageal reflux disease)   . History of stomach ulcers   . Hypothyroidism   . Shingles    Past Surgical History:  Procedure  Laterality Date  . ESOPHAGEAL MANOMETRY  2017  . ESOPHAGOGASTRODUODENOSCOPY  12/2015  . NASAL SEPTUM SURGERY    . NOSE REPAIR  2006  . 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   Review of Systems As per HPI

## 2018-11-13 NOTE — Patient Instructions (Addendum)
As we discussed please come to the lab for testing today. I decided to do electrolytes, kidney and livertests also  Orders Placed This Encounter     CBC with Differential     C-reactive protein     Sed Rate (ESR)     Comp Met (CMET)  Stop ondansetron  Use MiraLax for constipation if necessary  Try FD Donald Prose take 2 before meals and see if that helps. Samples to patient today.  Something I did not recommend but am now:  Take your temperature daily to see if it is elevated and take it around the time of the night sweats also.   Gatha Mayer, MD, [facility]

## 2018-11-23 DIAGNOSIS — Z3200 Encounter for pregnancy test, result unknown: Secondary | ICD-10-CM | POA: Diagnosis not present

## 2018-11-23 DIAGNOSIS — Z3A01 Less than 8 weeks gestation of pregnancy: Secondary | ICD-10-CM | POA: Diagnosis not present

## 2018-11-23 DIAGNOSIS — O0900 Supervision of pregnancy with history of infertility, unspecified trimester: Secondary | ICD-10-CM | POA: Diagnosis not present

## 2018-12-03 DIAGNOSIS — O0900 Supervision of pregnancy with history of infertility, unspecified trimester: Secondary | ICD-10-CM | POA: Diagnosis not present

## 2018-12-17 DIAGNOSIS — O0900 Supervision of pregnancy with history of infertility, unspecified trimester: Secondary | ICD-10-CM | POA: Diagnosis not present

## 2018-12-21 DIAGNOSIS — O26899 Other specified pregnancy related conditions, unspecified trimester: Secondary | ICD-10-CM | POA: Diagnosis not present

## 2018-12-21 DIAGNOSIS — N949 Unspecified condition associated with female genital organs and menstrual cycle: Secondary | ICD-10-CM | POA: Diagnosis not present

## 2019-01-01 DIAGNOSIS — Z116 Encounter for screening for other protozoal diseases and helminthiases: Secondary | ICD-10-CM | POA: Diagnosis not present

## 2019-01-01 DIAGNOSIS — Z3689 Encounter for other specified antenatal screening: Secondary | ICD-10-CM | POA: Diagnosis not present

## 2019-01-01 DIAGNOSIS — O0900 Supervision of pregnancy with history of infertility, unspecified trimester: Secondary | ICD-10-CM | POA: Diagnosis not present

## 2019-01-01 DIAGNOSIS — Z118 Encounter for screening for other infectious and parasitic diseases: Secondary | ICD-10-CM | POA: Diagnosis not present

## 2019-01-01 DIAGNOSIS — O99280 Endocrine, nutritional and metabolic diseases complicating pregnancy, unspecified trimester: Secondary | ICD-10-CM | POA: Diagnosis not present

## 2019-01-01 DIAGNOSIS — Z3682 Encounter for antenatal screening for nuchal translucency: Secondary | ICD-10-CM | POA: Diagnosis not present

## 2019-01-01 DIAGNOSIS — Z3A10 10 weeks gestation of pregnancy: Secondary | ICD-10-CM | POA: Diagnosis not present

## 2019-01-01 LAB — OB RESULTS CONSOLE ANTIBODY SCREEN: Antibody Screen: NEGATIVE

## 2019-01-01 LAB — OB RESULTS CONSOLE GC/CHLAMYDIA
Chlamydia: NEGATIVE
Gonorrhea: NEGATIVE

## 2019-01-01 LAB — OB RESULTS CONSOLE HIV ANTIBODY (ROUTINE TESTING): HIV: NONREACTIVE

## 2019-01-01 LAB — OB RESULTS CONSOLE HEPATITIS B SURFACE ANTIGEN: Hepatitis B Surface Ag: NEGATIVE

## 2019-01-01 LAB — OB RESULTS CONSOLE RUBELLA ANTIBODY, IGM: Rubella: NON-IMMUNE/NOT IMMUNE

## 2019-01-01 LAB — OB RESULTS CONSOLE ABO/RH: RH Type: POSITIVE

## 2019-01-01 LAB — OB RESULTS CONSOLE RPR: RPR: NONREACTIVE

## 2019-01-14 DIAGNOSIS — R300 Dysuria: Secondary | ICD-10-CM | POA: Diagnosis not present

## 2019-01-14 DIAGNOSIS — O26891 Other specified pregnancy related conditions, first trimester: Secondary | ICD-10-CM | POA: Diagnosis not present

## 2019-01-14 DIAGNOSIS — Z3402 Encounter for supervision of normal first pregnancy, second trimester: Secondary | ICD-10-CM | POA: Diagnosis not present

## 2019-01-15 DIAGNOSIS — Z3682 Encounter for antenatal screening for nuchal translucency: Secondary | ICD-10-CM | POA: Diagnosis not present

## 2019-01-15 DIAGNOSIS — Z3A12 12 weeks gestation of pregnancy: Secondary | ICD-10-CM | POA: Diagnosis not present

## 2019-01-15 DIAGNOSIS — O99280 Endocrine, nutritional and metabolic diseases complicating pregnancy, unspecified trimester: Secondary | ICD-10-CM | POA: Diagnosis not present

## 2019-01-19 DIAGNOSIS — R339 Retention of urine, unspecified: Secondary | ICD-10-CM | POA: Diagnosis not present

## 2019-01-22 DIAGNOSIS — R339 Retention of urine, unspecified: Secondary | ICD-10-CM | POA: Diagnosis not present

## 2019-01-26 DIAGNOSIS — R358 Other polyuria: Secondary | ICD-10-CM | POA: Diagnosis not present

## 2019-01-26 DIAGNOSIS — R339 Retention of urine, unspecified: Secondary | ICD-10-CM | POA: Diagnosis not present

## 2019-02-03 ENCOUNTER — Ambulatory Visit: Payer: Self-pay | Admitting: *Deleted

## 2019-02-03 NOTE — Telephone Encounter (Signed)
Patient has an appointment with PCP on 02/04/2019 at 8:30AM

## 2019-02-03 NOTE — Telephone Encounter (Signed)
Patient is [redacted] weeks pregnant. Dry cough since Monday keeping her up at times at night. Headache daily over the last week, tylenol helps. Puffy eyes, also. Denies fever/SOB/congestion/ear pain. Also, would like to be tested for covid19.Care Advice given. Transferred to [facility] for virtual appointment.  Reason for Disposition . [1] Patient also has allergy symptoms (e.g., itchy eyes, clear nasal discharge, postnasal drip) AND [2] they are acting up  Answer Assessment - Initial Assessment Questions 1. ONSET: "When did the cough begin?"     2 days ago 2. SEVERITY: "How bad is the cough today?"      Dry cough, kept her up some during the night 3. RESPIRATORY DISTRESS: "Describe your breathing."      none 4. FEVER: "Do you have a fever?" If so, ask: "What is your temperature, how was it measured, and when did it start?"     no 5. HEMOPTYSIS: "Are you coughing up any blood?" If so ask: "How much?" (flecks, streaks, tablespoons, etc.)     no 6. TREATMENT: "What have you done so far to treat the cough?" (e.g., meds, fluids, humidifier)     nothing 7. CARDIAC HISTORY: "Do you have any history of heart disease?" (e.g., heart attack, congestive heart failure)      hypothyroidism 8. LUNG HISTORY: "Do you have any history of lung disease?"  (e.g., pulmonary embolus, asthma, emphysema)     none 9. PE RISK FACTORS: "Do you have a history of blood clots?" (or: recent major surgery, recent prolonged travel, bedridden)     none 10. OTHER SYMPTOMS: "Do you have any other symptoms? (e.g., runny nose, wheezing, chest pain)       non 11. PREGNANCY: "Is there any chance you are pregnant?" "When was your last menstrual period?"      [redacted] weeks pregnant 12. TRAVEL: "Have you traveled out of the country in the last month?" (e.g., travel history, exposures)       no  Protocols used: COUGH - ACUTE NON-PRODUCTIVE-A-AH

## 2019-02-04 ENCOUNTER — Encounter: Payer: Self-pay | Admitting: Family Medicine

## 2019-02-04 ENCOUNTER — Other Ambulatory Visit: Payer: Self-pay

## 2019-02-04 ENCOUNTER — Ambulatory Visit (INDEPENDENT_AMBULATORY_CARE_PROVIDER_SITE_OTHER): Payer: BC Managed Care – PPO | Admitting: Family Medicine

## 2019-02-04 DIAGNOSIS — Z3A15 15 weeks gestation of pregnancy: Secondary | ICD-10-CM | POA: Diagnosis not present

## 2019-02-04 DIAGNOSIS — R519 Headache, unspecified: Secondary | ICD-10-CM

## 2019-02-04 DIAGNOSIS — R490 Dysphonia: Secondary | ICD-10-CM

## 2019-02-04 DIAGNOSIS — J302 Other seasonal allergic rhinitis: Secondary | ICD-10-CM | POA: Diagnosis not present

## 2019-02-04 DIAGNOSIS — R51 Headache: Secondary | ICD-10-CM

## 2019-02-04 NOTE — Progress Notes (Signed)
Virtual Visit via Video Note  I connected with Rudolpho Sevin on 02/04/19 at  8:30 AM EDT by a video enabled telemedicine application and verified that I am speaking with the correct person using two identifiers.  Location patient: home Location provider:work or home office Persons participating in the virtual visit: patient, provider  I discussed the limitations of evaluation and management by telemedicine and the availability of in person appointments. The patient expressed understanding and agreed to proceed.   HPI: Pt is [redacted] wks pregnant, followed by OB.  Pt feeling bad overall, but noticed increased coughing at night, fatigue, and HAs.   Pt noticed the cough last night.  Pt has underlying reflux and esophageal dysmotility/spasm, but feels like it is controlled.  She sleeps nearly upright on a wedge pillow.  Having hoarse/scratchy throat more in the morning.  Took Benadryl one night which may have helped.  Denies fever, sore throat, chills, sick contacts.    Last OB 7 days ago.  Has appt on Monday to check cervical length.   ROS: See pertinent positives and negatives per HPI.  Past Medical History:  Diagnosis Date  . Arthritis   . Asthma   . Celiac disease?    Permissive genes not present [facility]  . Chronic kidney disease   . Esophageal dysmotility   . GERD (gastroesophageal reflux disease)   . History of stomach ulcers   . Hypothyroidism   . Shingles     Past Surgical History:  Procedure Laterality Date  . ESOPHAGEAL MANOMETRY  2017  . ESOPHAGOGASTRODUODENOSCOPY  12/2015  . NASAL SEPTUM SURGERY    . NOSE REPAIR  2006  . [place] IMPEDANCE STUDY  2017    Family History  Problem Relation Age of Onset  . Graves' disease Mother   . Colon polyps Mother   . Hyperlipidemia Father   . Hypertension Father   . Healthy Sister   . Graves' disease Maternal Grandfather      Current Outpatient Medications:  .  Caraway Oil-Levomenthol (FDGARD) 25-20.75 MG CAPS, Take by  mouth. As directed, Disp: , Rfl:  .  dexlansoprazole (DEXILANT) 60 MG capsule, Take 60 mg by mouth daily., Disp: , Rfl:  .  famotidine (PEPCID) 20 MG tablet, Take 1 tablet (20 mg total) by mouth 2 (two) times daily., Disp: 180 tablet, Rfl: 3 .  fluticasone (FLONASE) 50 MCG/ACT nasal spray, Place 1 spray into both nostrils daily. (Patient not taking: Reported on 11/13/2018), Disp: 16 g, Rfl: 0 .  ibuprofen (ADVIL,MOTRIN) 800 MG tablet, Take 1 tablet (800 mg total) by mouth 3 (three) times daily. (Patient not taking: Reported on 11/13/2018), Disp: 21 tablet, Rfl: 0 .  levothyroxine (SYNTHROID, LEVOTHROID) 75 MCG tablet, Take 1 tablet by mouth daily., Disp: , Rfl:  .  meclizine (ANTIVERT) 25 MG tablet, Take 1 tablet (25 mg total) by mouth 3 (three) times daily as needed for dizziness. (Patient not taking: Reported on 11/13/2018), Disp: 30 tablet, Rfl: 0 .  ondansetron (ZOFRAN) 4 MG tablet, Take 1 tablet (4 mg total) by mouth every 8 (eight) hours as needed for nausea or vomiting., Disp: 20 tablet, Rfl: 0 .  Prenatal Vit-Fe Fumarate-FA (MULTIVITAMIN-PRENATAL) 27-0.8 MG TABS tablet, Take 1 tablet by mouth daily at 12 noon., Disp: , Rfl:  .  Probiotic Product (PROBIOTIC DAILY PO), Take by mouth daily., Disp: , Rfl:   EXAM:  VITALS per patient if applicable:  GENERAL: alert, oriented, appears well and in no acute distress  HEENT: atraumatic, conjunctiva clear, no obvious abnormalities on inspection of external nose and ears  NECK: normal movements of the head and neck  LUNGS: on inspection no signs of respiratory distress, breathing rate appears normal, no obvious gross SOB, gasping or wheezing  CV: no obvious cyanosis  MS: moves all visible extremities without noticeable abnormality  PSYCH/NEURO: pleasant and cooperative, no obvious depression or anxiety, speech and thought processing grossly intact  ASSESSMENT AND PLAN:  Discussed the following assessment and plan:  [redacted] weeks gestation of  pregnancy  -encouraged to keep OB appointments   Hoarseness of voice  -discussed possible causes including GERD, allergies, viral illness -continue current GERD management -discussed using claritin or other allergy med safe during pregnancy -if symptoms continue discussed COVID-19 testing.  Given info about area testing sites.  Nonintractable headache, unspecified chronicity pattern, unspecified headache type  -ok to use Tylenolo prn -consider saline nasal rinse  Seasonal allergies  -likely causing symptoms of hoarse/scratchy throat, cough, HA -discussed trying claritin and saline nasal spray or rinse   F/u prn with OB/Gyn   I discussed the assessment and treatment plan with the patient. The patient was provided an opportunity to ask questions and all were answered. The patient agreed with the plan and demonstrated an understanding of the instructions.   The patient was advised to call back or seek an in-person evaluation if the symptoms worsen or if the condition fails to improve as anticipated.   Billie Ruddy, MD

## 2019-02-08 DIAGNOSIS — Z361 Encounter for antenatal screening for raised alphafetoprotein level: Secondary | ICD-10-CM | POA: Diagnosis not present

## 2019-02-08 DIAGNOSIS — O3442 Maternal care for other abnormalities of cervix, second trimester: Secondary | ICD-10-CM | POA: Diagnosis not present

## 2019-02-08 DIAGNOSIS — Z3A15 15 weeks gestation of pregnancy: Secondary | ICD-10-CM | POA: Diagnosis not present

## 2019-02-24 DIAGNOSIS — R42 Dizziness and giddiness: Secondary | ICD-10-CM | POA: Diagnosis not present

## 2019-02-24 DIAGNOSIS — O99282 Endocrine, nutritional and metabolic diseases complicating pregnancy, second trimester: Secondary | ICD-10-CM | POA: Diagnosis not present

## 2019-02-24 DIAGNOSIS — Z3A18 18 weeks gestation of pregnancy: Secondary | ICD-10-CM | POA: Diagnosis not present

## 2019-03-01 ENCOUNTER — Telehealth: Payer: Self-pay | Admitting: Internal Medicine

## 2019-03-01 DIAGNOSIS — Z363 Encounter for antenatal screening for malformations: Secondary | ICD-10-CM | POA: Diagnosis not present

## 2019-03-01 DIAGNOSIS — O99282 Endocrine, nutritional and metabolic diseases complicating pregnancy, second trimester: Secondary | ICD-10-CM | POA: Diagnosis not present

## 2019-03-01 DIAGNOSIS — Z3A18 18 weeks gestation of pregnancy: Secondary | ICD-10-CM | POA: Diagnosis not present

## 2019-03-01 DIAGNOSIS — O43892 Other placental disorders, second trimester: Secondary | ICD-10-CM | POA: Diagnosis not present

## 2019-03-01 MED ORDER — DEXLANSOPRAZOLE 60 MG PO CPDR: 60.0000 mg | DELAYED_RELEASE_CAPSULE | Freq: Every day | ORAL | 3 refills | Status: DC

## 2019-03-01 NOTE — Telephone Encounter (Signed)
Dexilant refilled and I called and left her a detailed message that this has been done.  I just re-read the message and called the pharmacy and cancelled the rx and I have printed it out. I will call her and see how she wants to get it. She request I email it to her and she will call me back if it doesn't come. Confirmed email address.

## 2019-03-01 NOTE — Telephone Encounter (Signed)
Please advise Sir, thank you. 

## 2019-03-01 NOTE — Telephone Encounter (Signed)
Refill x 1 year

## 2019-03-01 NOTE — Telephone Encounter (Signed)
Pt called requesting prescription for Dexilant, she stated that his former GI in [facility] used to prescribe it so Dr. Carlean Purl has never prescribed it before. She said that she needs the script because her insurance does not cover it so she gets it from a [facility].

## 2019-03-15 ENCOUNTER — Encounter: Payer: Self-pay | Admitting: Family Medicine

## 2019-03-15 ENCOUNTER — Other Ambulatory Visit: Payer: Self-pay | Admitting: Family Medicine

## 2019-03-15 DIAGNOSIS — D229 Melanocytic nevi, unspecified: Secondary | ICD-10-CM

## 2019-03-31 DIAGNOSIS — Z3A23 23 weeks gestation of pregnancy: Secondary | ICD-10-CM | POA: Diagnosis not present

## 2019-03-31 DIAGNOSIS — R631 Polydipsia: Secondary | ICD-10-CM | POA: Diagnosis not present

## 2019-03-31 DIAGNOSIS — Z130 Encounter for screening for diseases of the blood and blood-forming organs and certain disorders involving the immune mechanism: Secondary | ICD-10-CM | POA: Diagnosis not present

## 2019-03-31 DIAGNOSIS — Z1329 Encounter for screening for other suspected endocrine disorder: Secondary | ICD-10-CM | POA: Diagnosis not present

## 2019-03-31 DIAGNOSIS — O99282 Endocrine, nutritional and metabolic diseases complicating pregnancy, second trimester: Secondary | ICD-10-CM | POA: Diagnosis not present

## 2019-04-06 ENCOUNTER — Encounter: Payer: Self-pay | Admitting: Family Medicine

## 2019-04-20 DIAGNOSIS — Z3689 Encounter for other specified antenatal screening: Secondary | ICD-10-CM | POA: Diagnosis not present

## 2019-04-20 DIAGNOSIS — Z3402 Encounter for supervision of normal first pregnancy, second trimester: Secondary | ICD-10-CM | POA: Diagnosis not present

## 2019-05-04 DIAGNOSIS — Z872 Personal history of diseases of the skin and subcutaneous tissue: Secondary | ICD-10-CM | POA: Diagnosis not present

## 2019-05-04 DIAGNOSIS — L858 Other specified epidermal thickening: Secondary | ICD-10-CM | POA: Diagnosis not present

## 2019-05-04 DIAGNOSIS — D1801 Hemangioma of skin and subcutaneous tissue: Secondary | ICD-10-CM | POA: Diagnosis not present

## 2019-05-04 DIAGNOSIS — D225 Melanocytic nevi of trunk: Secondary | ICD-10-CM | POA: Diagnosis not present

## 2019-05-13 DIAGNOSIS — Z3403 Encounter for supervision of normal first pregnancy, third trimester: Secondary | ICD-10-CM | POA: Diagnosis not present

## 2019-05-13 DIAGNOSIS — Z3689 Encounter for other specified antenatal screening: Secondary | ICD-10-CM | POA: Diagnosis not present

## 2019-05-13 DIAGNOSIS — Z23 Encounter for immunization: Secondary | ICD-10-CM | POA: Diagnosis not present

## 2019-05-26 DIAGNOSIS — O368130 Decreased fetal movements, third trimester, not applicable or unspecified: Secondary | ICD-10-CM | POA: Diagnosis not present

## 2019-05-26 DIAGNOSIS — Z3A31 31 weeks gestation of pregnancy: Secondary | ICD-10-CM | POA: Diagnosis not present

## 2019-05-28 DIAGNOSIS — O99283 Endocrine, nutritional and metabolic diseases complicating pregnancy, third trimester: Secondary | ICD-10-CM | POA: Diagnosis not present

## 2019-05-28 DIAGNOSIS — Z3A31 31 weeks gestation of pregnancy: Secondary | ICD-10-CM | POA: Diagnosis not present

## 2019-05-28 DIAGNOSIS — O99013 Anemia complicating pregnancy, third trimester: Secondary | ICD-10-CM | POA: Diagnosis not present

## 2019-06-09 DIAGNOSIS — Z3A33 33 weeks gestation of pregnancy: Secondary | ICD-10-CM | POA: Diagnosis not present

## 2019-06-09 DIAGNOSIS — O99283 Endocrine, nutritional and metabolic diseases complicating pregnancy, third trimester: Secondary | ICD-10-CM | POA: Diagnosis not present

## 2019-06-21 DIAGNOSIS — Z3685 Encounter for antenatal screening for Streptococcus B: Secondary | ICD-10-CM | POA: Diagnosis not present

## 2019-06-21 DIAGNOSIS — O99283 Endocrine, nutritional and metabolic diseases complicating pregnancy, third trimester: Secondary | ICD-10-CM | POA: Diagnosis not present

## 2019-06-21 DIAGNOSIS — O26899 Other specified pregnancy related conditions, unspecified trimester: Secondary | ICD-10-CM | POA: Diagnosis not present

## 2019-06-21 DIAGNOSIS — Z3A34 34 weeks gestation of pregnancy: Secondary | ICD-10-CM | POA: Diagnosis not present

## 2019-06-21 LAB — OB RESULTS CONSOLE GBS: GBS: NEGATIVE

## 2019-06-28 DIAGNOSIS — O99283 Endocrine, nutritional and metabolic diseases complicating pregnancy, third trimester: Secondary | ICD-10-CM | POA: Diagnosis not present

## 2019-06-28 DIAGNOSIS — O99280 Endocrine, nutritional and metabolic diseases complicating pregnancy, unspecified trimester: Secondary | ICD-10-CM | POA: Diagnosis not present

## 2019-06-28 DIAGNOSIS — O99013 Anemia complicating pregnancy, third trimester: Secondary | ICD-10-CM | POA: Diagnosis not present

## 2019-06-28 DIAGNOSIS — Z3A35 35 weeks gestation of pregnancy: Secondary | ICD-10-CM | POA: Diagnosis not present

## 2019-07-05 DIAGNOSIS — Z3A36 36 weeks gestation of pregnancy: Secondary | ICD-10-CM | POA: Diagnosis not present

## 2019-07-05 DIAGNOSIS — O99280 Endocrine, nutritional and metabolic diseases complicating pregnancy, unspecified trimester: Secondary | ICD-10-CM | POA: Diagnosis not present

## 2019-07-05 DIAGNOSIS — O99013 Anemia complicating pregnancy, third trimester: Secondary | ICD-10-CM | POA: Diagnosis not present

## 2019-07-14 DIAGNOSIS — Z3A38 38 weeks gestation of pregnancy: Secondary | ICD-10-CM | POA: Diagnosis not present

## 2019-07-14 DIAGNOSIS — O99013 Anemia complicating pregnancy, third trimester: Secondary | ICD-10-CM | POA: Diagnosis not present

## 2019-07-14 DIAGNOSIS — O99280 Endocrine, nutritional and metabolic diseases complicating pregnancy, unspecified trimester: Secondary | ICD-10-CM | POA: Diagnosis not present

## 2019-07-15 ENCOUNTER — Other Ambulatory Visit: Payer: Self-pay | Admitting: Family Medicine

## 2019-07-17 ENCOUNTER — Other Ambulatory Visit: Payer: Self-pay | Admitting: Advanced Practice Midwife

## 2019-07-19 ENCOUNTER — Encounter (HOSPITAL_COMMUNITY): Payer: Self-pay | Admitting: *Deleted

## 2019-07-19 ENCOUNTER — Telehealth (HOSPITAL_COMMUNITY): Payer: Self-pay | Admitting: *Deleted

## 2019-07-19 NOTE — Telephone Encounter (Signed)
Preadmission screen  

## 2019-07-20 ENCOUNTER — Other Ambulatory Visit: Payer: Self-pay | Admitting: Obstetrics

## 2019-07-20 DIAGNOSIS — Z3A39 39 weeks gestation of pregnancy: Secondary | ICD-10-CM | POA: Diagnosis not present

## 2019-07-20 DIAGNOSIS — O99013 Anemia complicating pregnancy, third trimester: Secondary | ICD-10-CM | POA: Diagnosis not present

## 2019-07-20 DIAGNOSIS — O99283 Endocrine, nutritional and metabolic diseases complicating pregnancy, third trimester: Secondary | ICD-10-CM | POA: Diagnosis not present

## 2019-07-21 ENCOUNTER — Other Ambulatory Visit (HOSPITAL_COMMUNITY)
Admission: RE | Admit: 2019-07-21 | Discharge: 2019-07-21 | Disposition: A | Discharge status: Home / Self Care | Payer: BC Managed Care – PPO | Source: Ambulatory Visit | Attending: Obstetrics | Admitting: Obstetrics

## 2019-07-21 DIAGNOSIS — Z01812 Encounter for preprocedural laboratory examination: Secondary | ICD-10-CM | POA: Diagnosis not present

## 2019-07-21 DIAGNOSIS — Z20828 Contact with and (suspected) exposure to other viral communicable diseases: Secondary | ICD-10-CM | POA: Insufficient documentation

## 2019-07-21 LAB — SARS CORONAVIRUS 2 (TAT 6-24 HRS): SARS Coronavirus 2: NEGATIVE

## 2019-07-23 ENCOUNTER — Inpatient Hospital Stay (HOSPITAL_COMMUNITY): Payer: BC Managed Care – PPO | Admitting: Anesthesiology

## 2019-07-23 ENCOUNTER — Encounter (HOSPITAL_COMMUNITY): Payer: Self-pay | Admitting: *Deleted

## 2019-07-23 ENCOUNTER — Other Ambulatory Visit: Payer: Self-pay

## 2019-07-23 ENCOUNTER — Inpatient Hospital Stay (HOSPITAL_COMMUNITY): Payer: BC Managed Care – PPO

## 2019-07-23 ENCOUNTER — Inpatient Hospital Stay (HOSPITAL_COMMUNITY)
Admission: AD | Admit: 2019-07-23 | Discharge: 2019-07-25 | DRG: 807 | Disposition: A | Discharge status: Home / Self Care | Payer: BC Managed Care – PPO | Attending: Obstetrics | Admitting: Obstetrics

## 2019-07-23 DIAGNOSIS — E039 Hypothyroidism, unspecified: Secondary | ICD-10-CM | POA: Diagnosis present

## 2019-07-23 DIAGNOSIS — O09899 Supervision of other high risk pregnancies, unspecified trimester: Secondary | ICD-10-CM

## 2019-07-23 DIAGNOSIS — O26893 Other specified pregnancy related conditions, third trimester: Secondary | ICD-10-CM | POA: Diagnosis not present

## 2019-07-23 DIAGNOSIS — Z3490 Encounter for supervision of normal pregnancy, unspecified, unspecified trimester: Secondary | ICD-10-CM | POA: Diagnosis present

## 2019-07-23 DIAGNOSIS — Z2839 Other underimmunization status: Secondary | ICD-10-CM

## 2019-07-23 DIAGNOSIS — O99284 Endocrine, nutritional and metabolic diseases complicating childbirth: Secondary | ICD-10-CM | POA: Diagnosis not present

## 2019-07-23 DIAGNOSIS — Z3A39 39 weeks gestation of pregnancy: Secondary | ICD-10-CM | POA: Diagnosis not present

## 2019-07-23 DIAGNOSIS — Z283 Underimmunization status: Secondary | ICD-10-CM

## 2019-07-23 LAB — TYPE AND SCREEN
ABO/RH(D): A POS
Antibody Screen: NEGATIVE

## 2019-07-23 LAB — CBC
HCT: 38.7 % (ref 36.0–46.0)
Hemoglobin: 13.2 g/dL (ref 12.0–15.0)
MCH: 31.6 pg (ref 26.0–34.0)
MCHC: 34.1 g/dL (ref 30.0–36.0)
MCV: 92.6 fL (ref 80.0–100.0)
Platelets: 205 10*3/uL (ref 150–400)
RBC: 4.18 MIL/uL (ref 3.87–5.11)
RDW: 16.2 % — ABNORMAL HIGH (ref 11.5–15.5)
WBC: 7.1 10*3/uL (ref 4.0–10.5)
nRBC: 0 % (ref 0.0–0.2)

## 2019-07-23 LAB — RPR: RPR Ser Ql: NONREACTIVE

## 2019-07-23 LAB — ABO/RH: ABO/RH(D): A POS

## 2019-07-23 MED ORDER — LIDOCAINE HCL (PF) 1 % IJ SOLN
30.0000 mL | INTRAMUSCULAR | Status: AC | PRN
  Administered 2019-07-23: 30 mL via SUBCUTANEOUS
  Filled 2019-07-23: qty 30

## 2019-07-23 MED ORDER — OXYTOCIN BOLUS FROM INFUSION
500.0000 mL | Freq: Once | INTRAVENOUS | Status: AC
  Administered 2019-07-23: 500 mL via INTRAVENOUS

## 2019-07-23 MED ORDER — IBUPROFEN 600 MG PO TABS
600.0000 mg | ORAL_TABLET | Freq: Four times a day (QID) | ORAL | Status: DC
  Administered 2019-07-23 – 2019-07-25 (×6): 600 mg via ORAL
  Filled 2019-07-23 (×8): qty 1

## 2019-07-23 MED ORDER — TERBUTALINE SULFATE 1 MG/ML IJ SOLN: 0.2500 mg | Freq: Once | INTRAMUSCULAR | Status: DC | PRN

## 2019-07-23 MED ORDER — FAMOTIDINE 20 MG PO TABS
40.0000 mg | ORAL_TABLET | Freq: Every day | ORAL | Status: DC
  Administered 2019-07-23 – 2019-07-24 (×2): 40 mg via ORAL
  Filled 2019-07-23 (×2): qty 2

## 2019-07-23 MED ORDER — SOD CITRATE-CITRIC ACID 500-334 MG/5ML PO SOLN: 30.0000 mL | ORAL | Status: DC | PRN

## 2019-07-23 MED ORDER — DIPHENHYDRAMINE HCL 25 MG PO CAPS: 25.0000 mg | ORAL_CAPSULE | Freq: Four times a day (QID) | ORAL | Status: DC | PRN

## 2019-07-23 MED ORDER — LIDOCAINE HCL (PF) 1 % IJ SOLN
INTRAMUSCULAR | Status: DC | PRN
  Administered 2019-07-23: 12 mL via EPIDURAL
  Administered 2019-07-23: 5 mL via EPIDURAL

## 2019-07-23 MED ORDER — OXYTOCIN 40 UNITS IN NORMAL SALINE INFUSION - SIMPLE MED
1.0000 m[IU]/min | INTRAVENOUS | Status: DC
  Administered 2019-07-23: 2 m[IU]/min via INTRAVENOUS
  Filled 2019-07-23: qty 1000

## 2019-07-23 MED ORDER — COCONUT OIL OIL
1.0000 "application " | TOPICAL_OIL | Status: DC | PRN
  Administered 2019-07-25: 1 via TOPICAL

## 2019-07-23 MED ORDER — ZOLPIDEM TARTRATE 5 MG PO TABS: 5.0000 mg | ORAL_TABLET | Freq: Every evening | ORAL | Status: DC | PRN

## 2019-07-23 MED ORDER — SIMETHICONE 80 MG PO CHEW: 80.0000 mg | CHEWABLE_TABLET | ORAL | Status: DC | PRN

## 2019-07-23 MED ORDER — PHENYLEPHRINE 40 MCG/ML (10ML) SYRINGE FOR IV PUSH (FOR BLOOD PRESSURE SUPPORT)
80.0000 ug | PREFILLED_SYRINGE | INTRAVENOUS | Status: DC | PRN
  Administered 2019-07-23: 80 ug via INTRAVENOUS

## 2019-07-23 MED ORDER — TETANUS-DIPHTH-ACELL PERTUSSIS 5-2.5-18.5 LF-MCG/0.5 IM SUSP: 0.5000 mL | Freq: Once | INTRAMUSCULAR | Status: DC

## 2019-07-23 MED ORDER — OXYTOCIN 40 UNITS IN NORMAL SALINE INFUSION - SIMPLE MED: 2.5000 [IU]/h | INTRAVENOUS | Status: DC

## 2019-07-23 MED ORDER — ONDANSETRON HCL 4 MG PO TABS: 4.0000 mg | ORAL_TABLET | ORAL | Status: DC | PRN

## 2019-07-23 MED ORDER — ONDANSETRON HCL 4 MG/2ML IJ SOLN
4.0000 mg | Freq: Four times a day (QID) | INTRAMUSCULAR | Status: DC | PRN
  Administered 2019-07-23: 4 mg via INTRAVENOUS
  Filled 2019-07-23: qty 2

## 2019-07-23 MED ORDER — DIBUCAINE (PERIANAL) 1 % EX OINT: 1.0000 "application " | TOPICAL_OINTMENT | CUTANEOUS | Status: DC | PRN

## 2019-07-23 MED ORDER — EPHEDRINE 5 MG/ML INJ
10.0000 mg | INTRAVENOUS | Status: AC | PRN
  Administered 2019-07-23 (×2): 10 mg via INTRAVENOUS

## 2019-07-23 MED ORDER — PHENYLEPHRINE 40 MCG/ML (10ML) SYRINGE FOR IV PUSH (FOR BLOOD PRESSURE SUPPORT)
80.0000 ug | PREFILLED_SYRINGE | INTRAVENOUS | Status: DC | PRN
  Filled 2019-07-23: qty 10

## 2019-07-23 MED ORDER — PRENATAL MULTIVITAMIN CH
1.0000 | ORAL_TABLET | Freq: Every day | ORAL | Status: DC
  Administered 2019-07-24: 1 via ORAL
  Filled 2019-07-23 (×2): qty 1

## 2019-07-23 MED ORDER — EPHEDRINE 5 MG/ML INJ
10.0000 mg | INTRAVENOUS | Status: DC | PRN
  Filled 2019-07-23: qty 10

## 2019-07-23 MED ORDER — OXYCODONE HCL 5 MG PO TABS: 5.0000 mg | ORAL_TABLET | ORAL | Status: DC | PRN

## 2019-07-23 MED ORDER — DIPHENHYDRAMINE HCL 50 MG/ML IJ SOLN: 12.5000 mg | INTRAMUSCULAR | Status: DC | PRN

## 2019-07-23 MED ORDER — LACTATED RINGERS IV SOLN
500.0000 mL | INTRAVENOUS | Status: DC | PRN
  Administered 2019-07-23: 500 mL via INTRAVENOUS

## 2019-07-23 MED ORDER — BENZOCAINE-MENTHOL 20-0.5 % EX AERO
1.0000 "application " | INHALATION_SPRAY | CUTANEOUS | Status: DC | PRN
  Administered 2019-07-23: 1 via TOPICAL
  Filled 2019-07-23: qty 56

## 2019-07-23 MED ORDER — SENNOSIDES-DOCUSATE SODIUM 8.6-50 MG PO TABS
2.0000 | ORAL_TABLET | ORAL | Status: DC
  Administered 2019-07-24 – 2019-07-25 (×2): 2 via ORAL
  Filled 2019-07-23 (×2): qty 2

## 2019-07-23 MED ORDER — ONDANSETRON HCL 4 MG/2ML IJ SOLN: 4.0000 mg | INTRAMUSCULAR | Status: DC | PRN

## 2019-07-23 MED ORDER — CEPHALEXIN 250 MG PO CAPS
250.0000 mg | ORAL_CAPSULE | Freq: Every day | ORAL | Status: DC
  Administered 2019-07-23 – 2019-07-24 (×2): 250 mg via ORAL
  Filled 2019-07-23 (×3): qty 1

## 2019-07-23 MED ORDER — CEPHALEXIN 250 MG PO CAPS: 250.0000 mg | ORAL_CAPSULE | Freq: Every day | ORAL | Status: DC

## 2019-07-23 MED ORDER — LACTATED RINGERS IV SOLN
INTRAVENOUS | Status: DC
  Administered 2019-07-23 (×2): via INTRAVENOUS

## 2019-07-23 MED ORDER — ACETAMINOPHEN 325 MG PO TABS: 650.0000 mg | ORAL_TABLET | ORAL | Status: DC | PRN

## 2019-07-23 MED ORDER — OXYCODONE HCL 5 MG PO TABS: 10.0000 mg | ORAL_TABLET | ORAL | Status: DC | PRN

## 2019-07-23 MED ORDER — LACTATED RINGERS IV SOLN
500.0000 mL | Freq: Once | INTRAVENOUS | Status: AC
  Administered 2019-07-23: 500 mL via INTRAVENOUS

## 2019-07-23 MED ORDER — MEASLES, MUMPS & RUBELLA VAC IJ SOLR
0.5000 mL | Freq: Once | INTRAMUSCULAR | Status: AC
  Administered 2019-07-25: 0.5 mL via SUBCUTANEOUS
  Filled 2019-07-23: qty 0.5

## 2019-07-23 MED ORDER — FENTANYL-BUPIVACAINE-NACL 0.5-0.125-0.9 MG/250ML-% EP SOLN
12.0000 mL/h | EPIDURAL | Status: DC | PRN
  Filled 2019-07-23: qty 250

## 2019-07-23 MED ORDER — WITCH HAZEL-GLYCERIN EX PADS: 1.0000 "application " | MEDICATED_PAD | CUTANEOUS | Status: DC | PRN

## 2019-07-23 NOTE — Progress Notes (Signed)
S: Doing well, no complaints, pain well controlled at this time, likely epidural  O: BP 104/71   Pulse 70   Temp 98.2 F (36.8 C) (Oral)   Resp 17   Ht 5' 5"  (1.651 m)   Wt 55.3 kg   LMP 09/28/2018   BMI 20.29 kg/m    FHT:  FHR: 120s bpm, variability: moderate,  accelerations:  Present,  decelerations:  Absent UC:   regular, every 2-3 minutes, pit at 12 munits/ min SVE:   Dilation: 3 Effacement (%): 90 Station: -1 Exam by:: dr Pamala Hurry AROM clear  CBC    Component Value Date/Time   WBC 7.1 07/23/2019 0747   RBC 4.18 07/23/2019 0747   HGB 13.2 07/23/2019 0747   HCT 38.7 07/23/2019 0747   PLT 205 07/23/2019 0747   MCV 92.6 07/23/2019 0747   MCH 31.6 07/23/2019 0747   MCHC 34.1 07/23/2019 0747   RDW 16.2 (H) 07/23/2019 0747   LYMPHSABS 2.4 11/13/2018 1133   MONOABS 0.6 11/13/2018 1133   EOSABS 0.0 11/13/2018 1133   BASOSABS 0.0 11/13/2018 1133     A / P:  29 y.o.  OB History  Gravida Para Term Preterm AB Living  1 0 0 0 0 0  SAB TAB Ectopic Multiple Live Births  0 0 0 0 0   at 70w3dIOL, elective, term  Fetal Wellbeing:  Category I Pain Control:  Labor support without medications  Anticipated MOD:  NSVD  KAla Dach12/11/2018, 2:41 PM

## 2019-07-23 NOTE — Anesthesia Preprocedure Evaluation (Signed)
Anesthesia Evaluation  Patient identified by MRN, date of birth, ID band Patient awake    Reviewed: Allergy & Precautions, NPO status , Patient's Chart, lab work & pertinent test results  Airway Mallampati: II  TM Distance: >3 FB Neck ROM: Full    Dental no notable dental hx. 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, L5-S1 epidural steroids in past   Abdominal   Peds  Hematology Hgb 13.2 Plt 205   Anesthesia Other Findings   Reproductive/Obstetrics                             Anesthesia Physical Anesthesia Plan  ASA: II  Anesthesia Plan: Epidural   Post-op Pain Management:    Induction:   PONV Risk Score and Plan:   Airway Management Planned:   Additional Equipment:   Intra-op Plan:   Post-operative Plan:   Informed Consent: I have reviewed the patients History and Physical, chart, labs and discussed the procedure including the risks, benefits and alternatives for the proposed anesthesia with the patient or authorized representative who has indicated his/her understanding and acceptance.       Plan Discussed with:   Anesthesia Plan Comments: (39 3/7 G1P0 for LEA)        Anesthesia Quick Evaluation

## 2019-07-23 NOTE — Anesthesia Procedure Notes (Signed)
Epidural Patient location during procedure: OB Start time: 07/23/2019 3:14 PM End time: 07/23/2019 3:31 PM  Staffing Anesthesiologist: Barnet Glasgow, MD Performed: anesthesiologist   Preanesthetic Checklist Completed: patient identified, site marked, surgical consent, pre-op evaluation, timeout performed, IV checked, risks and benefits discussed and monitors and equipment checked  Epidural Patient position: sitting Prep: site prepped and draped and DuraPrep Patient monitoring: continuous pulse ox and blood pressure Approach: midline Location: L3-L4 Injection technique: LOR air  Needle:  Needle type: Tuohy  Needle gauge: 17 G Needle length: 9 cm and 9 Needle insertion depth: 6 cm Catheter type: closed end flexible Catheter size: 19 Gauge Catheter at skin depth: 11 cm Test dose: negative  Assessment Events: blood not aspirated, injection not painful, no injection resistance, negative IV test and no paresthesia  Additional Notes Patient identified. Risks/Benefits/Options discussed with patient including but not limited to bleeding, infection, nerve damage, paralysis, failed block, incomplete pain control, headache, blood pressure changes, nausea, vomiting, reactions to medication both or allergic, itching and postpartum back pain. Confirmed with bedside nurse the patient's most recent platelet count. Confirmed with patient that they are not currently taking any anticoagulation, have any bleeding history or any family history of bleeding disorders. Patient expressed understanding and wished to proceed. All questions were answered. Sterile technique was used throughout the entire procedure. Please see nursing notes for vital signs. Test dose was given through epidural needle and negative prior to continuing to dose epidural or start infusion. Warning signs of high block given to the patient including shortness of breath, tingling/numbness in hands, complete motor block, or any  concerning symptoms with instructions to call for help. Patient was given instructions on fall risk and not to get out of bed. All questions and concerns addressed with instructions to call with any issues. 1 Attempt (S) . Patient tolerated procedure well.

## 2019-07-23 NOTE — H&P (Signed)
Kristine Taylor is a 28 y.o. G1P0 at 24w3dpresenting for IOL. Pt notes rare contractions . Good fetal movement, No vaginal bleeding, not leaking fluid.  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 On Macrobid through preg, Keflex at 36 wks - b/l CPC - h/o LEEP, nl CL - baby with L hydrocele, otherwise AGA at 38 wks   Prenatal Transfer Tool  Maternal Diabetes: No Genetic Screening: Normal Maternal Ultrasounds/Referrals: Normal Fetal Ultrasounds or other Referrals:  None Maternal Substance Abuse:  No Significant Maternal Medications:  None Significant Maternal Lab Results: Group B Strep negative     OB History    Gravida  1   Para      Term      Preterm      AB      Living        SAB      TAB      Ectopic      Multiple      Live Births             Past Medical History:  Diagnosis Date  . Arthritis   . Asthma   . Celiac disease?    Permissive genes not present on [facility] testing  . Chronic kidney disease   . Esophageal dysmotility   . GERD (gastroesophageal reflux disease)   . History of stomach ulcers   . Hypothyroidism   . Shingles   . Vaginal Pap smear, abnormal    Past Surgical History:  Procedure Laterality Date  . ESOPHAGEAL MANOMETRY  2017  . ESOPHAGOGASTRODUODENOSCOPY  12/2015  . LEEP    . NASAL SEPTUM SURGERY    . NOSE REPAIR  2006  . PArabiIMPEDANCE STUDY  2017   Family History: family history includes Colon polyps in her mother; GBerenice Primas disease in her maternal grandfather and mother; Healthy in her sister; Hyperlipidemia in her father; Hypertension in her father. Social History:  reports that she has never smoked. She has never used smokeless tobacco. She reports current  alcohol use. She reports that she does not use drugs.  Review of Systems - Negative except discomfort of preg     Blood pressure 104/68, pulse 87, temperature 98.7 F (37.1 C), temperature source Oral, resp. rate 17, height 5' 5"  (1.651 m), weight 55.3 kg, last menstrual period 09/28/2018.  Physical Exam:    Prenatal labs: ABO, Rh: A/Positive/-- (05/15 0000) Antibody: Negative (05/15 0000) Rubella: Nonimmune (05/15 0000) RPR: Nonreactive (05/15 0000)  HBsAg: Negative (05/15 0000)  HIV: Non-reactive (05/15 0000)  GBS: Negative/-- (11/02 0000)  1 hr Glucola 90  Genetic screening nl NT, nl AFP Anatomy UKoreab/l CPC   Assessment/Plan: 28y.o. G1P0 at 31w3dlective IOL, pit 2x2, AROM when able GBS neg H/o pyelo, macrobid PP, H/o urinary retention, keep foley in 24-48 hrs post-epiral   KeAla Dach2/11/2018, 8:13 AM

## 2019-07-24 DIAGNOSIS — Z2839 Other underimmunization status: Secondary | ICD-10-CM

## 2019-07-24 DIAGNOSIS — Z283 Underimmunization status: Secondary | ICD-10-CM

## 2019-07-24 DIAGNOSIS — O09899 Supervision of other high risk pregnancies, unspecified trimester: Secondary | ICD-10-CM

## 2019-07-24 LAB — CBC
HCT: 34.9 % — ABNORMAL LOW (ref 36.0–46.0)
Hemoglobin: 11.8 g/dL — ABNORMAL LOW (ref 12.0–15.0)
MCH: 32 pg (ref 26.0–34.0)
MCHC: 33.8 g/dL (ref 30.0–36.0)
MCV: 94.6 fL (ref 80.0–100.0)
Platelets: 192 10*3/uL (ref 150–400)
RBC: 3.69 MIL/uL — ABNORMAL LOW (ref 3.87–5.11)
RDW: 16.6 % — ABNORMAL HIGH (ref 11.5–15.5)
WBC: 11 10*3/uL — ABNORMAL HIGH (ref 4.0–10.5)
nRBC: 0 % (ref 0.0–0.2)

## 2019-07-24 MED ORDER — LEVOTHYROXINE SODIUM 75 MCG PO TABS
150.0000 ug | ORAL_TABLET | Freq: Every day | ORAL | Status: DC
  Administered 2019-07-24 – 2019-07-25 (×2): 150 ug via ORAL
  Filled 2019-07-24 (×2): qty 2

## 2019-07-24 NOTE — Lactation Note (Signed)
This note was copied from a baby's chart. Lactation Consultation Note  Patient Name: Kristine Taylor [id number] Date: 07/24/2019 Reason for consult: Follow-up assessment   P1, Baby 31 hours old and cueing.  Reviewed hand expression with good flow. Assisted with latching in football hold and used breast compression to keep him active. Reviewed basics. Feed on demand with cues.  Goal 8-12+ times per day after first 24 hrs.  Place baby STS if not cueing.  Answered questions and encouraged parents.    Maternal Data Has patient been taught Hand Expression?: Yes  Feeding Feeding Type: Breast Fed  LATCH Score Latch: Grasps breast easily, tongue down, lips flanged, rhythmical sucking.  Audible Swallowing: A few with stimulation  Type of Nipple: Everted at rest and after stimulation  Comfort (Breast/Nipple): Soft / non-tender  Hold (Positioning): Assistance needed to correctly position infant at breast and maintain latch.  LATCH Score: 8  Interventions Interventions: Breast feeding basics reviewed;Assisted with latch;Skin to skin;Hand express;Support pillows;Breast compression;Adjust position;Position options  Lactation Tools Discussed/Used     Consult Status Consult Status: Follow-up Date: 07/25/19 Follow-up type: In-patient    Vivianne Master [facility] 07/24/2019, 1:23 PM

## 2019-07-24 NOTE — Progress Notes (Signed)
PPD #1 SVD, left mediolateral episiotomy and bilateral periurethral lacs, baby boy "Reece"  S:  Reports feeling okay, very tired, reports some tailbone pain from prolonged sitting because she doesn't want to move due to the catheter.   C/o mild lightheadedness with standing             Tolerating po/ No nausea or vomiting / Denies CP or SOB             Bleeding is getting lighter              Pain controlled with Motrin             Up ad lib / ambulatory / voiding QS without difficulty  Newborn breast feeding - going well, but feels like baby has been sleepy   / Circumcision - planning  O:               VS: BP 103/71 (BP Location: Right Arm)   Pulse 75   Temp 98.4 F (36.9 C) (Oral)   Resp 16   Ht 5' 5"  (1.651 m)   Wt 55.3 kg   LMP 09/28/2018   SpO2 98%   Breastfeeding Unknown   BMI 20.29 kg/m    LABS:              Recent Labs    07/23/19 0747  WBC 7.1  HGB 13.2  PLT 205               Blood type: --/--/A POS, A POS Performed at [facility], [place] [address], [place], [place] [address]  [phone])  Rubella: Nonimmune (05/15 0000)                     I&O: Intake/Output      12/04 0701 - 12/05 0700 12/05 0701 - 12/06 0700   P.O. 3984 2484   Total Intake(mL/kg) 3984 (72) 2484 (44.9)   Urine (mL/kg/hr) 6100 4275 (10.5)   Blood 200    Total Output 6300 4275   Net -2316 -1791                      Physical Exam:             Alert and oriented X3  Lungs: Clear and unlabored  Heart: regular rate and rhythm / no murmurs  Abdomen: soft, non-tender, non-distended              Fundus: firm, non-tender, U-2  Perineum: (+) edema, mild erythema   Lochia: small rubra, ice pack in place  Extremities:  No edema, no calf pain or tenderness    A/P: PPD # 1, SVD  S/p Left mediolateral episiotomy and bilateral periurethral lacs  Hx. Of urinary retention   - remove foley catheter at 24 hours     - Ensure void within 6 hrs of removal   - Keflex 225m PO daily  QHS  Rubella non-immune   - Offer MMR prior to d/c  Dizziness   - Stat CBC  Doing well - stable status  Routine post partum orders  Circ prior to d/c  Encouraged to breastfeed every 2-3 hours  Side-lying positions for tailbone pain  Continue current care   Anticipate d/c home tomorrow   MLars Pinks MSN, [facility]

## 2019-07-24 NOTE — Anesthesia Postprocedure Evaluation (Signed)
Anesthesia Post Note  Patient: Kristine Taylor  Procedure(s) Performed: AN AD [place]     Patient location during evaluation: Mother Baby Anesthesia Type: Epidural Level of consciousness: awake and alert, oriented and patient cooperative Pain management: pain level controlled Vital Signs Assessment: post-procedure vital signs reviewed and stable Respiratory status: spontaneous breathing Cardiovascular status: stable Postop Assessment: no headache, epidural receding, patient able to bend at knees and no signs of nausea or vomiting Anesthetic complications: no Comments: Pt. States she is able to walk.  Pain score 1.  Pt. States she has some residual numbness in low back.  Houser MD aware and he has evaluated patient.     Last Vitals:  Vitals:   07/24/19 0512 07/24/19 0745  BP: 96/71 103/71  Pulse: 71 75  Resp: 16 16  Temp: 36.6 C 36.9 C  SpO2: 100% 98%    Last Pain:  Vitals:   07/24/19 0746  TempSrc:   PainSc: 3    Pain Goal:                   [facility]

## 2019-07-24 NOTE — Lactation Note (Signed)
This note was copied from a baby's chart. Lactation Consultation Note Baby 37 hrs old. Mom stated that she had BF well about an hour prior to [facility] coming into rm. Mom states baby is latching well. Denies painful latches. LC assessed mom's breast. Noted significant softening to breast baby just fed from. Encouraged mom to assess for transfer.  Mom has everted nipples. Taught mom hand expression, mom demonstrated hand expressing colostrum. Newborn behavior, STS, I&O, breast massage, supply and demand. Mom encouraged to feed baby 8-12 times/24 hours and with feeding cues.  Answered questions mom had.  A lot of teaching positioning, support and props discussed.  Encouraged mom to call for questions or concerns. Lactation brochure given.  Patient Name: Kristine Taylor [id number] Date: 07/24/2019 Reason for consult: Initial assessment;Primapara;Maternal endocrine disorder Type of Endocrine Disorder?: Thyroid   Maternal Data Has patient been taught Hand Expression?: Yes Does the patient have breastfeeding experience prior to this delivery?: No  Feeding Feeding Type: Breast Fed  LATCH Score Latch: Repeated attempts needed to sustain latch, nipple held in mouth throughout feeding, stimulation needed to elicit sucking reflex.  Audible Swallowing: A few with stimulation  Type of Nipple: Everted at rest and after stimulation  Comfort (Breast/Nipple): Soft / non-tender  Hold (Positioning): Assistance needed to correctly position infant at breast and maintain latch.  LATCH Score: 7  Interventions Interventions: Breast feeding basics reviewed;Support pillows;Position options;Breast massage;Hand express;Breast compression  Lactation Tools Discussed/Used WIC Program: No   Consult Status Consult Status: Follow-up Date: 07/24/19 Follow-up type: In-patient    Theodoro Kalata 07/24/2019, 2:41 AM

## 2019-07-25 MED ORDER — CEPHALEXIN 250 MG PO CAPS: 250.0000 mg | ORAL_CAPSULE | Freq: Every day | ORAL | 1 refills | Status: DC

## 2019-07-25 NOTE — Lactation Note (Signed)
This note was copied from a baby's chart. Lactation Consultation Note  Patient Name: Kristine Taylor MITVI'F Date: 07/25/2019 Reason for consult: Follow-up assessment;Primapara;1st time breastfeeding;Term;Infant weight loss  Baby is 83 hours old  As LC entered the room the [facility] had set up the DEBP and this  '[place] finished instructing mom with the hand pump and the DEBP.  #59 F is a good fit . Sore nipples and engorgement prevention and tx  Reviewed.  Mom has a hand pump a DEBP kit and a DEBP Spectra at home.  LC plan  Feed 8-12 times a day  Prior to latching moist heat, breast massage, hand express, pre-pump with HAKKA or hand pump and latch with firm support.  Feed for 15 -20 mins . Supplement with EBM upto 30 ml and post pump after at least 6 feedings a day for 10 -15 mins , save milk for the next feeding.  LC stressed STS feedings until the baby can stay awake for majority of feeding, back to birth weight and gaining steadily.  LC to F/U after [facility] finishes with mom.   Maternal Data Has patient been taught Hand Expression?: Yes  Feeding Feeding Type: Breast Fed  LATCH Score                   Interventions Interventions: Breast feeding basics reviewed  Lactation Tools Discussed/Used Tools: Pump;Flanges Flange Size: 24 Breast pump type: Double-Electric Breast Pump;Manual Pump Review: Setup, frequency, and cleaning;Milk Storage Initiated by:: MAI  Date initiated:: 07/25/19   Consult Status Consult Status: Follow-up Date: 07/25/19 Follow-up type: In-patient    [place] 07/25/2019, 8:47 AM

## 2019-07-25 NOTE — Lactation Note (Signed)
This note was copied from a baby's chart. Lactation Consultation Note  Patient Name: Boy Aneisa Karren INOMV'E Date: 07/25/2019 Reason for consult: Follow-up assessment;Primapara;1st time breastfeeding;Term;Infant weight loss;Other (Comment)(2nd LC visit) Type of Endocrine Disorder?: Thyroid  Baby is 41 hours old  Per mom the baby had recently fed.  [place] finished her consult and instructed mom on the use shells and comfort  Gels due to sore nipples .  LC assessed earlier with consult and noted no breakdown, only pink.  May be transient due to milk coming in.  MBURN provided the coconut oil and mom is aware not to mix with comfort  Gels.  LC reviewed he LC plan - in the LC's previous note and mom expressed she  Had a good understanding of what to do.  Mom has the [facility] pamphlet with phone numbers if needed and is aware her [place] office has a [place].    Maternal Data Has patient been taught Hand Expression?: Yes  Feeding Feeding Type: (per mom baby recently fed / sleeping at present time)  LATCH Score                   Interventions Interventions: Breast feeding basics reviewed  Lactation Tools Discussed/Used Tools: Shells;Flanges;Pump;Comfort gels;Coconut oil Flange Size: 24 Shell Type: Inverted Breast pump type: Double-Electric Breast Pump;Manual Pump Review: Milk Storage;Setup, frequency, and cleaning Initiated by:: MAI  Date initiated:: 07/25/19   Consult Status Consult Status: Complete(mom aware her [facility] office has a [place]) Date: 07/25/19 Follow-up type: In-patient    [place] 07/25/2019, 9:35 AM

## 2019-07-25 NOTE — Discharge Summary (Signed)
OB Discharge Summary  Patient Name: Kristine Taylor DOB: 02/13/1991 MRN: [id number]  Date of admission: 07/23/2019 Delivering MD: Aloha Gell   Date of discharge: 07/25/2019  Admitting diagnosis: PREG Intrauterine pregnancy: [redacted]w[redacted]d    Secondary diagnosis:Principal Problem:   Postpartum care following vaginal delivery (12/4) Active Problems:   Encounter for planned induction of labor   Rubella non-immune status, antepartum   SVD (spontaneous vaginal delivery)   Obstetrical laceration: Left mediolateral episiotomy and bil periurethral lacs  Additional problems:none     Discharge diagnosis: Term Pregnancy Delivered                                                                     Post partum procedures:none  Augmentation: AROM and Pitocin  Complications: None  Hospital course:  Induction of Labor With Vaginal Delivery   28y.o. yo G1P1001 at 324w3das admitted to the hospital 07/23/2019 for induction of labor.  Indication for induction: Favorable cervix at term.  Patient had an uncomplicated labor course as follows: Membrane Rupture Time/Date: 2:32 PM ,07/23/2019   Intrapartum Procedures: Episiotomy: Left Mediolateral [3]                                         Lacerations:  Periurethral [8]  Patient had delivery of a Viable infant.  Information for the patient's newborn:  WaKirti, Carl0[[id number]]Delivery Method: Vaginal, Spontaneous(Filed from Delivery Summary)    07/23/2019  Details of delivery can be found in separate delivery note.  Patient had a routine postpartum course. Patient is discharged home 07/25/19.  On day of d/c, pPD # 2, pt reports excessive void and thirst but finally feeling sense to void, has been timing voids q 2 hrs with 300-900cc output. Continues to drink lots of water. Breastfeeding w/o complication. Minimal lochia. Occasional dizziness.   Physical exam  Vitals:   07/24/19 0745 07/24/19 1530 07/24/19 2225 07/25/19 0658  BP:  103/71 94/62 97/62  96/62  Pulse: 75 79 64 70  Resp: 16 16 18 18   Temp: 98.4 F (36.9 C) 98.2 F (36.8 C) 98.1 F (36.7 C) 98.8 F (37.1 C)  TempSrc: Oral Oral Oral Oral  SpO2: 98% 99% 99% 98%  Weight:      Height:       General: alert and no distress Lochia: appropriate Uterine Fundus: firm Incision: N/A DVT Evaluation: No evidence of DVT seen on physical exam. Labs: Lab Results  Component Value Date   WBC 11.0 (H) 07/24/2019   HGB 11.8 (L) 07/24/2019   HCT 34.9 (L) 07/24/2019   MCV 94.6 07/24/2019   PLT 192 07/24/2019   CMP Latest Ref Rng & Units 11/13/2018  Glucose 70 - 99 mg/dL 87  BUN 6 - 23 mg/dL 12  Creatinine 0.40 - 1.20 mg/dL 0.75  Sodium 135 - 145 mEq/L 138  Potassium 3.5 - 5.1 mEq/L 4.8  Chloride 96 - 112 mEq/L 104  CO2 19 - 32 mEq/L 28  Calcium 8.4 - 10.5 mg/dL 9.7  Total Protein 6.0 - 8.3 g/dL 7.4  Total Bilirubin 0.2 - 1.2 mg/dL 0.7  Alkaline Phos 39 -  117 U/L 58  AST 0 - 37 U/L 16  ALT 0 - 35 U/L 15    Discharge instruction: per After Visit Summary and "Baby and Me Booklet".  After Visit Meds:  Allergies as of 07/25/2019      Reactions   Gluten Meal       Medication List    STOP taking these medications   fluticasone 50 MCG/ACT nasal spray Commonly known as: FLONASE   meclizine 25 MG tablet Commonly known as: ANTIVERT   ondansetron 4 MG tablet Commonly known as: ZOFRAN     TAKE these medications   cephALEXin 250 MG capsule Commonly known as: KEFLEX Take 1 capsule (250 mg total) by mouth daily. What changed:   medication strength  how much to take   dexlansoprazole 60 MG capsule Commonly known as: DEXILANT Take 1 capsule (60 mg total) by mouth daily.   docusate sodium 100 MG capsule Commonly known as: COLACE Take 100 mg by mouth daily as needed for mild constipation.   famotidine 20 MG tablet Commonly known as: PEPCID Take 1 tablet (20 mg total) by mouth 2 (two) times daily.   Ferralet 90 90-1 MG Tabs Take 1 tablet by  mouth daily.   ibuprofen 800 MG tablet Commonly known as: ADVIL Take 1 tablet (800 mg total) by mouth 3 (three) times daily.   levothyroxine 75 MCG tablet Commonly known as: SYNTHROID Take 75-150 mcg by mouth See admin instructions. Taking 66mg on Mon, Tues, Thur, Friday and taking 1584m on Wed, Saturday and Sunday   multivitamin-prenatal 27-0.8 MG Tabs tablet Take 1 tablet by mouth daily at 12 noon.   PROBIOTIC DAILY PO Take 1 tablet by mouth daily.       Diet: routine diet  Activity: Advance as tolerated. Pelvic rest for 6 weeks.   Outpatient follow up:6 weeks Follow up Appt:No future appointments. Follow up visit: No follow-ups on file.  If continues with polyuria will w/u for DI.   Postpartum contraception: Not Discussed  Newborn Data: Live born female  Birth Weight: 7 lb 10.1 oz (3460 g) APGAR: 9,289  Newborn Delivery   Birth date/time: 07/23/2019 17:12:00 Delivery type: Vaginal, Spontaneous      Baby Feeding: Breast Disposition:home with mother   07/25/2019 KeAla DachMD

## 2019-07-30 DIAGNOSIS — R358 Other polyuria: Secondary | ICD-10-CM | POA: Diagnosis not present

## 2019-07-30 DIAGNOSIS — R631 Polydipsia: Secondary | ICD-10-CM | POA: Diagnosis not present

## 2019-07-30 DIAGNOSIS — R42 Dizziness and giddiness: Secondary | ICD-10-CM | POA: Diagnosis not present

## 2019-08-16 DIAGNOSIS — R631 Polydipsia: Secondary | ICD-10-CM | POA: Diagnosis not present

## 2019-08-16 DIAGNOSIS — R358 Other polyuria: Secondary | ICD-10-CM | POA: Diagnosis not present

## 2019-08-18 DIAGNOSIS — R358 Other polyuria: Secondary | ICD-10-CM | POA: Diagnosis not present

## 2019-08-26 DIAGNOSIS — R631 Polydipsia: Secondary | ICD-10-CM | POA: Diagnosis not present

## 2019-08-26 DIAGNOSIS — R358 Other polyuria: Secondary | ICD-10-CM | POA: Diagnosis not present

## 2019-08-26 DIAGNOSIS — Z7189 Other specified counseling: Secondary | ICD-10-CM | POA: Diagnosis not present

## 2019-08-26 DIAGNOSIS — Z6820 Body mass index (BMI) 20.0-20.9, adult: Secondary | ICD-10-CM | POA: Diagnosis not present

## 2019-09-03 DIAGNOSIS — Z124 Encounter for screening for malignant neoplasm of cervix: Secondary | ICD-10-CM | POA: Diagnosis not present

## 2019-09-03 DIAGNOSIS — E039 Hypothyroidism, unspecified: Secondary | ICD-10-CM | POA: Diagnosis not present

## 2019-09-03 DIAGNOSIS — Z1151 Encounter for screening for human papillomavirus (HPV): Secondary | ICD-10-CM | POA: Diagnosis not present

## 2019-09-06 ENCOUNTER — Other Ambulatory Visit: Payer: Self-pay | Admitting: Ophthalmology

## 2019-09-06 ENCOUNTER — Other Ambulatory Visit (HOSPITAL_COMMUNITY): Payer: Self-pay | Admitting: Ophthalmology

## 2019-09-06 DIAGNOSIS — I152 Hypertension secondary to endocrine disorders: Secondary | ICD-10-CM

## 2019-09-06 DIAGNOSIS — Z6820 Body mass index (BMI) 20.0-20.9, adult: Secondary | ICD-10-CM | POA: Diagnosis not present

## 2019-09-06 DIAGNOSIS — R358 Other polyuria: Secondary | ICD-10-CM | POA: Diagnosis not present

## 2019-09-06 DIAGNOSIS — R631 Polydipsia: Secondary | ICD-10-CM | POA: Diagnosis not present

## 2019-09-06 DIAGNOSIS — H21563 Pupillary abnormality, bilateral: Secondary | ICD-10-CM

## 2019-09-06 DIAGNOSIS — Z7189 Other specified counseling: Secondary | ICD-10-CM | POA: Diagnosis not present

## 2019-09-06 DIAGNOSIS — E1159 Type 2 diabetes mellitus with other circulatory complications: Secondary | ICD-10-CM

## 2019-09-10 ENCOUNTER — Encounter (HOSPITAL_COMMUNITY): Payer: Self-pay

## 2019-09-10 ENCOUNTER — Ambulatory Visit (HOSPITAL_COMMUNITY): Payer: BC Managed Care – PPO

## 2019-09-17 DIAGNOSIS — E232 Diabetes insipidus: Secondary | ICD-10-CM | POA: Diagnosis not present

## 2019-09-17 DIAGNOSIS — H21569 Pupillary abnormality, unspecified eye: Secondary | ICD-10-CM | POA: Diagnosis not present

## 2019-09-17 DIAGNOSIS — R519 Headache, unspecified: Secondary | ICD-10-CM | POA: Diagnosis not present

## 2019-09-18 ENCOUNTER — Ambulatory Visit (HOSPITAL_COMMUNITY): Payer: BC Managed Care – PPO

## 2019-09-21 DIAGNOSIS — E039 Hypothyroidism, unspecified: Secondary | ICD-10-CM | POA: Diagnosis not present

## 2019-09-21 DIAGNOSIS — R358 Other polyuria: Secondary | ICD-10-CM | POA: Diagnosis not present

## 2019-09-26 ENCOUNTER — Other Ambulatory Visit: Payer: Self-pay | Admitting: Internal Medicine

## 2019-09-27 DIAGNOSIS — Z7189 Other specified counseling: Secondary | ICD-10-CM | POA: Diagnosis not present

## 2019-09-27 DIAGNOSIS — Z6820 Body mass index (BMI) 20.0-20.9, adult: Secondary | ICD-10-CM | POA: Diagnosis not present

## 2019-09-27 DIAGNOSIS — R358 Other polyuria: Secondary | ICD-10-CM | POA: Diagnosis not present

## 2019-09-27 DIAGNOSIS — R631 Polydipsia: Secondary | ICD-10-CM | POA: Diagnosis not present

## 2019-10-02 ENCOUNTER — Ambulatory Visit: Payer: BC Managed Care – PPO

## 2019-10-20 ENCOUNTER — Telehealth (INDEPENDENT_AMBULATORY_CARE_PROVIDER_SITE_OTHER): Payer: BC Managed Care – PPO | Admitting: Family Medicine

## 2019-10-20 DIAGNOSIS — M5416 Radiculopathy, lumbar region: Secondary | ICD-10-CM

## 2019-10-20 DIAGNOSIS — R29898 Other symptoms and signs involving the musculoskeletal system: Secondary | ICD-10-CM

## 2019-10-20 DIAGNOSIS — Z8739 Personal history of other diseases of the musculoskeletal system and connective tissue: Secondary | ICD-10-CM | POA: Diagnosis not present

## 2019-10-20 MED ORDER — CYCLOBENZAPRINE HCL 5 MG PO TABS: 5.0000 mg | ORAL_TABLET | Freq: Three times a day (TID) | ORAL | 0 refills | Status: DC | PRN

## 2019-10-20 NOTE — Progress Notes (Signed)
Virtual Visit via Telephone Note  I connected with Kristine Taylor on 10/20/19 at  4:30 PM EST by telephone and verified that I am speaking with the correct person using two identifiers.   I discussed the limitations, risks, security and privacy concerns of performing an evaluation and management service by telephone and the availability of in person appointments. I also discussed with the patient that there may be a patient responsible charge related to this service. The patient expressed understanding and agreed to proceed.  Location patient: home Location provider: work or home office Participants present for the call: patient, provider Patient did not have a visit in the prior 7 days to address this/these issue(s).   History of Present Illness: Pt is a 29 yo G1P1 female with pmh sig for GERD, hypothyroidism, esophageal spasm, celiac dz, shingles, asthma.  This am pt had sudden, intense, lower back pain after picking up her son out of his crib.  Weakness in R side with trying to stand on tip toes or bend toes.   Pain radiating in hip and upper thigh.  Pt having difficulty sitting.  Pain worse with bending forward.  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  Pt is currently breastfeeding.   Observations/Objective: Patient sounds cheerful and well on the phone. I do not appreciate any SOB. Speech and thought processing are grossly intact. Patient reported vitals:  Assessment and Plan: Lumbar back pain with radiculopathy affecting left lower extremity  -given hx and current symptoms (weakness in toes/LE) will order MRI  -Advised Flexeril can be breast milk.  Discussed Relative Infant Dose.  Advised to use qhs prn - Plan: cyclobenzaprine (FLEXERIL) 5 MG tablet, MR Lumbar Spine Wo Contrast  Weakness of right lower extremity  - Plan: MR Lumbar Spine Wo Contrast  History of herniated intervertebral disc  - Plan: MR Lumbar Spine Wo  Contrast   Follow Up Instructions: F/u in the next few days for continued symptoms   I discussed the assessment and treatment plan with the patient. The patient was provided an opportunity to ask questions and all were answered. The patient agreed with the plan and demonstrated an understanding of the instructions.   The patient was advised to call back or seek an in-person evaluation if the symptoms worsen or if the condition fails to improve as anticipated.  I provided 15 minutes of non-face-to-face time during this encounter.   Billie Ruddy, MD

## 2019-11-18 DIAGNOSIS — E039 Hypothyroidism, unspecified: Secondary | ICD-10-CM | POA: Diagnosis not present

## 2019-11-25 DIAGNOSIS — E039 Hypothyroidism, unspecified: Secondary | ICD-10-CM | POA: Diagnosis not present

## 2019-11-25 DIAGNOSIS — R631 Polydipsia: Secondary | ICD-10-CM | POA: Diagnosis not present

## 2019-11-25 DIAGNOSIS — Z7189 Other specified counseling: Secondary | ICD-10-CM | POA: Diagnosis not present

## 2019-11-25 DIAGNOSIS — Z6820 Body mass index (BMI) 20.0-20.9, adult: Secondary | ICD-10-CM | POA: Diagnosis not present

## 2019-12-21 DIAGNOSIS — N644 Mastodynia: Secondary | ICD-10-CM | POA: Diagnosis not present

## 2019-12-27 DIAGNOSIS — R339 Retention of urine, unspecified: Secondary | ICD-10-CM | POA: Diagnosis not present

## 2019-12-27 DIAGNOSIS — N61 Mastitis without abscess: Secondary | ICD-10-CM | POA: Diagnosis not present

## 2020-01-03 DIAGNOSIS — E039 Hypothyroidism, unspecified: Secondary | ICD-10-CM | POA: Diagnosis not present

## 2020-01-06 DIAGNOSIS — R631 Polydipsia: Secondary | ICD-10-CM | POA: Diagnosis not present

## 2020-01-06 DIAGNOSIS — E232 Diabetes insipidus: Secondary | ICD-10-CM | POA: Diagnosis not present

## 2020-01-06 DIAGNOSIS — Z6820 Body mass index (BMI) 20.0-20.9, adult: Secondary | ICD-10-CM | POA: Diagnosis not present

## 2020-01-06 DIAGNOSIS — Z7189 Other specified counseling: Secondary | ICD-10-CM | POA: Diagnosis not present

## 2020-01-15 ENCOUNTER — Emergency Department (HOSPITAL_COMMUNITY)
Admission: EM | Admit: 2020-01-15 | Discharge: 2020-01-15 | Disposition: A | Discharge status: Home / Self Care | Payer: BC Managed Care – PPO | Attending: Emergency Medicine | Admitting: Emergency Medicine

## 2020-01-15 ENCOUNTER — Other Ambulatory Visit: Payer: Self-pay

## 2020-01-15 ENCOUNTER — Encounter (HOSPITAL_COMMUNITY): Payer: Self-pay | Admitting: *Deleted

## 2020-01-15 DIAGNOSIS — S61312A Laceration without foreign body of right middle finger with damage to nail, initial encounter: Secondary | ICD-10-CM

## 2020-01-15 DIAGNOSIS — S6991XA Unspecified injury of right wrist, hand and finger(s), initial encounter: Secondary | ICD-10-CM | POA: Diagnosis not present

## 2020-01-15 DIAGNOSIS — Y9389 Activity, other specified: Secondary | ICD-10-CM | POA: Diagnosis not present

## 2020-01-15 DIAGNOSIS — Z23 Encounter for immunization: Secondary | ICD-10-CM | POA: Insufficient documentation

## 2020-01-15 DIAGNOSIS — E039 Hypothyroidism, unspecified: Secondary | ICD-10-CM | POA: Insufficient documentation

## 2020-01-15 DIAGNOSIS — N189 Chronic kidney disease, unspecified: Secondary | ICD-10-CM | POA: Diagnosis not present

## 2020-01-15 DIAGNOSIS — J45909 Unspecified asthma, uncomplicated: Secondary | ICD-10-CM | POA: Insufficient documentation

## 2020-01-15 DIAGNOSIS — Y999 Unspecified external cause status: Secondary | ICD-10-CM | POA: Diagnosis not present

## 2020-01-15 DIAGNOSIS — S61302A Unspecified open wound of right middle finger with damage to nail, initial encounter: Secondary | ICD-10-CM | POA: Diagnosis not present

## 2020-01-15 DIAGNOSIS — Z79899 Other long term (current) drug therapy: Secondary | ICD-10-CM | POA: Diagnosis not present

## 2020-01-15 DIAGNOSIS — W274XXA Contact with kitchen utensil, initial encounter: Secondary | ICD-10-CM | POA: Diagnosis not present

## 2020-01-15 DIAGNOSIS — Y92009 Unspecified place in unspecified non-institutional (private) residence as the place of occurrence of the external cause: Secondary | ICD-10-CM | POA: Insufficient documentation

## 2020-01-15 DIAGNOSIS — S61212A Laceration without foreign body of right middle finger without damage to nail, initial encounter: Secondary | ICD-10-CM | POA: Diagnosis not present

## 2020-01-15 MED ORDER — TETANUS-DIPHTH-ACELL PERTUSSIS 5-2.5-18.5 LF-MCG/0.5 IM SUSP
0.5000 mL | Freq: Once | INTRAMUSCULAR | Status: AC
  Administered 2020-01-15: 0.5 mL via INTRAMUSCULAR
  Filled 2020-01-15: qty 0.5

## 2020-01-15 MED ORDER — BACITRACIN ZINC 500 UNIT/GM EX OINT
TOPICAL_OINTMENT | Freq: Two times a day (BID) | CUTANEOUS | Status: DC
  Administered 2020-01-15: 1 via TOPICAL
  Filled 2020-01-15: qty 0.9

## 2020-01-15 NOTE — ED Notes (Signed)
Patient given discharge instructions. Questions were answered. Patient verbalized understanding of discharge instructions and care at home.  

## 2020-01-15 NOTE — ED Triage Notes (Signed)
Laceration through her rt middle nailbed approx one hour ago while cutting sweet potatoes  Small cut to the rt index finger  Bleeding controlled  lmp none

## 2020-01-15 NOTE — Discharge Instructions (Signed)
Please keep your clean and dry when you are not washing it.  In order to clean it please use warm soapy water.  Pat dry and cover with thin layer of bacitracin and use a Band-Aid.  Please use Tylenol or ibuprofen for pain.  You may use 600 mg ibuprofen every 6 hours or 1000 mg of Tylenol every 6 hours.  You may choose to alternate between the 2.  This would be most effective.  Not to exceed 4 g of Tylenol within 24 hours.  Not to exceed 3200 mg ibuprofen 24 hours.

## 2020-01-15 NOTE — ED Provider Notes (Signed)
[facility] Provider Note   CSN: [id number] Arrival date & time: 01/15/20  1522     History Chief Complaint  Patient presents with  . Laceration    Kristine Taylor is a 29 y.o. female.  HPI Patient is 29 year old female with right middle finger laceration that is painful, constant, 2/10, achy, worse with touch and movement.  She was using a Engineer, maintenance (IT) approximately 1 hour prior to my evaluation when she was distracted by her 25-monthold child and walked away prompting her to cut herself.  She avulsed the very tip of her right middle finger including so the nail.  She states that there is significant bleeding immediately following the injury however this resolved after elevating and compressing it.  She denies any lightheadedness, dizziness, fatigue, nausea, vomiting, other injuries.  States she is otherwise feeling well.  She is unsure when her last tetanus was and is requesting update today if her status cannot be found in the EMR.  On my review of EMR last tetanus was 2010.  Patient was recently hospitalized for labor and delivery but tetanus update was deferred for unknown reason at the time.  Will update today.     Past Medical History:  Diagnosis Date  . Arthritis   . Asthma   . Celiac disease?    Permissive genes not present on [facility]  . Chronic kidney disease   . Esophageal dysmotility   . GERD (gastroesophageal reflux disease)   . History of stomach ulcers   . Hypothyroidism   . Shingles   . Vaginal Pap smear, abnormal     Patient Active Problem List   Diagnosis Date Noted  . Rubella non-immune status, antepartum 07/24/2019  . SVD (spontaneous vaginal delivery) 07/24/2019  . Postpartum care following vaginal delivery (12/4) 07/24/2019  . Obstetrical laceration: Left mediolateral episiotomy and bil periurethral lacs 07/24/2019  . Encounter for planned induction of labor 07/23/2019  . H/O  hemorrhoids 09/04/2018  . Celiac disease 09/04/2018  . Constipation 09/04/2018  . Esophageal spasm 09/04/2018  . Acquired hypothyroidism 09/04/2018  . Arthritis 09/04/2018  . SACROILIAC JOINT DYSFUNCTION 07/10/2010  . 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 SPRAIN AND STRAIN OF LUMBOSACRAL 07/10/2010  . 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 ASTHMA 03/30/2010  . SHINGLES, HX OF 03/30/2010    Past Surgical History:  Procedure Laterality Date  . ESOPHAGEAL MANOMETRY  2017  . ESOPHAGOGASTRODUODENOSCOPY  12/2015  . LEEP    . NASAL SEPTUM SURGERY    . [place]  2006  [place]     OB History    Gravida  1   Para  1   Term  1   Preterm      AB      Living  1     SAB      TAB      Ectopic      Multiple  0   Live Births  1           Family History  Problem Relation Age of Onset  . Graves' disease Mother   . Colon polyps Mother   . Hyperlipidemia Father   . Hypertension Father   . Healthy Sister   . Graves' disease Maternal Grandfather     Social History   Tobacco Use  . Smoking status: Never Smoker  . Smokeless tobacco: Never Used  Substance Use Topics  . Alcohol use: Yes  Comment: OCCASIONAL  . Drug use: Never    Home Medications Prior to Admission medications   Medication Sig Start Date End Date Taking? Authorizing Provider  cephALEXin (KEFLEX) 250 MG capsule Take 1 capsule (250 mg total) by mouth daily. 07/25/19   Aloha Gell, MD  cyclobenzaprine (FLEXERIL) 5 MG tablet Take 1 tablet (5 mg total) by mouth 3 (three) times daily as needed for muscle spasms. 10/20/19   Billie Ruddy, MD  dexlansoprazole (DEXILANT) 60 MG capsule Take 1 capsule (60 mg total) by mouth daily. 03/01/19   Gatha Mayer, MD  docusate sodium (COLACE) 100 MG capsule Take 100 mg by mouth daily as needed for mild constipation.    [provider]  famotidine (PEPCID) 20 MG tablet TAKE 1 TABLET BY MOUTH TWICE A DAY 09/27/19   Gatha Mayer, MD  Fe  Cbn-Fe Gluc-FA-B12-C-DSS (FERRALET 90) 90-1 MG TABS Take 1 tablet by mouth daily.    [provider]  ibuprofen (ADVIL,MOTRIN) 800 MG tablet Take 1 tablet (800 mg total) by mouth 3 (three) times daily. Patient not taking: Reported on 11/13/2018 09/26/18   Wieters, Madelynn Done C, PA-C  levothyroxine (SYNTHROID, LEVOTHROID) 75 MCG tablet Take 75-150 mcg by mouth See admin instructions. Taking 28mg on Mon, Tues, Thur, Friday and taking 1549m on Wed, Saturday and Sunday 09/29/18   [provider]  Prenatal Vit-Fe Fumarate-FA (MULTIVITAMIN-PRENATAL) 27-0.8 MG TABS tablet Take 1 tablet by mouth daily at 12 noon.    [provider]  Probiotic Product (PROBIOTIC DAILY PO) Take 1 tablet by mouth daily.     [provider]    Allergies    Gluten meal  Review of Systems   Review of Systems  Constitutional: Negative for fever.  HENT: Negative for congestion.   Respiratory: Negative for shortness of breath.   Cardiovascular: Negative for chest pain.  Gastrointestinal: Negative for abdominal distention.  Skin:       Right middle fingertip pain  Neurological: Negative for dizziness and headaches.    Physical Exam Updated Vital Signs BP 122/77 (BP Location: Right Arm)   Pulse 72   Temp 98.4 F (36.9 C) (Oral)   Resp 14   Ht 5' 5"  (1.651 m)   Wt 56.7 kg   SpO2 100%   BMI 20.80 kg/m   Physical Exam Vitals and nursing note reviewed.  Constitutional:      General: She is not in acute distress.    Appearance: Normal appearance. She is not ill-appearing.  HENT:     Head: Normocephalic and atraumatic.     Mouth/Throat:     Mouth: Mucous membranes are moist.  Eyes:     General: No scleral icterus.       Right eye: No discharge.        Left eye: No discharge.     Conjunctiva/sclera: Conjunctivae normal.  Pulmonary:     Effort: Pulmonary effort is normal.     Breath sounds: No stridor.  Musculoskeletal:     Comments: For range of motion of flexion extension of  finger DIP.  Skin:    Capillary Refill: Capillary refill takes less than 2 seconds.     Comments: Right middle finger tip avulsion of skin.  The distal quarter of the fingertip has been removed however the nailbed is still intact apart from the very tip where there may be a small amount of nailbed missing.  There is no active bleeding.  To the finger is mildly tender to palpation.  She has good sensation and good cap refill.  Neurological:     Mental Status: She is alert and oriented to person, place, and time. Mental status is at baseline.     Comments: Sensation intact in fingertips.  Psychiatric:        Mood and Affect: Mood normal.        Behavior: Behavior normal.     ED Results / Procedures / Treatments   Labs (all labs ordered are listed, but only abnormal results are displayed) Labs Reviewed - No data to display  EKG None  Radiology No results found.  Procedures Procedures (including critical care time)  Medications Ordered in ED Medications  Tdap (BOOSTRIX) injection 0.5 mL (has no administration in time range)  bacitracin ointment (has no administration in time range)    ED Course  I have reviewed the triage vital signs and the nursing notes.  Pertinent labs & imaging results that were available during my care of the patient were reviewed by me and considered in my medical decision making (see chart for details).    MDM Rules/Calculators/A&P                      Patient Davy Pique female with no medical problems presented today with right little finger laceration/skin avulsion.  This is by a Health and safety inspector.  She has nailbed damage.  Wound was cleaned there is no laceration to repair but rather just an avulsion.  She will be bandaged with a Band-Aid and a thin layer bacitracin.  Pressure irrigation performed. Wound explored and base of wound visualized in a bloodless field without evidence of foreign body.  Tdap updated.  Pt has no comorbidities to effect normal wound  healing. Pt discharged without antibiotics.  Discussed suture home care with patient and answered questions.   Patient sent home with several packs of bacitracin.  Final Clinical Impression(s) / ED Diagnoses Final diagnoses:  Laceration of right middle finger without foreign body with damage to nail, initial encounter    Rx / DC Orders ED Discharge Orders    None       Tedd Sias, [place] 01/15/20 1716    Virgel Manifold, MD 01/15/20 1732

## 2020-02-14 DIAGNOSIS — E039 Hypothyroidism, unspecified: Secondary | ICD-10-CM | POA: Diagnosis not present

## 2020-02-14 DIAGNOSIS — E232 Diabetes insipidus: Secondary | ICD-10-CM | POA: Diagnosis not present

## 2020-02-16 ENCOUNTER — Telehealth: Payer: Self-pay

## 2020-02-16 MED ORDER — DEXLANSOPRAZOLE 60 MG PO CPDR: 60.0000 mg | DELAYED_RELEASE_CAPSULE | Freq: Every day | ORAL | 3 refills | Status: DC

## 2020-02-16 NOTE — Telephone Encounter (Signed)
We received a fax from [facility] for patient's Dexilant 28m one daily. This refill was approved and faxed to [phone], customer # [phone] There phone # is [phone]

## 2020-02-17 DIAGNOSIS — E232 Diabetes insipidus: Secondary | ICD-10-CM | POA: Diagnosis not present

## 2020-02-17 DIAGNOSIS — Z6820 Body mass index (BMI) 20.0-20.9, adult: Secondary | ICD-10-CM | POA: Diagnosis not present

## 2020-02-17 DIAGNOSIS — Z7189 Other specified counseling: Secondary | ICD-10-CM | POA: Diagnosis not present

## 2020-02-17 DIAGNOSIS — R631 Polydipsia: Secondary | ICD-10-CM | POA: Diagnosis not present

## 2020-02-28 DIAGNOSIS — Z01419 Encounter for gynecological examination (general) (routine) without abnormal findings: Secondary | ICD-10-CM | POA: Diagnosis not present

## 2020-02-28 DIAGNOSIS — Z6820 Body mass index (BMI) 20.0-20.9, adult: Secondary | ICD-10-CM | POA: Diagnosis not present

## 2020-03-29 ENCOUNTER — Other Ambulatory Visit: Payer: Self-pay

## 2020-03-29 ENCOUNTER — Encounter: Payer: Self-pay | Admitting: Family Medicine

## 2020-03-29 ENCOUNTER — Ambulatory Visit: Payer: BC Managed Care – PPO | Admitting: Family Medicine

## 2020-03-29 VITALS — BP 118/60 | HR 87 | Temp 98.2°F | Wt 123.6 lb

## 2020-03-29 DIAGNOSIS — G43109 Migraine with aura, not intractable, without status migrainosus: Secondary | ICD-10-CM

## 2020-03-29 MED ORDER — SUMATRIPTAN SUCCINATE 100 MG PO TABS: ORAL_TABLET | ORAL | 0 refills | Status: DC

## 2020-03-29 MED ORDER — DESMOPRESSIN ACETATE SPRAY 0.01 % NA SOLN: 10.0000 ug | Freq: Every day | NASAL | 12 refills | Status: DC
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
## 2020-03-31 ENCOUNTER — Encounter: Payer: Self-pay | Admitting: Neurology

## 2020-03-31 ENCOUNTER — Telehealth (INDEPENDENT_AMBULATORY_CARE_PROVIDER_SITE_OTHER): Payer: BC Managed Care – PPO | Admitting: Family Medicine

## 2020-03-31 ENCOUNTER — Encounter: Payer: Self-pay | Admitting: Family Medicine

## 2020-03-31 DIAGNOSIS — R519 Headache, unspecified: Secondary | ICD-10-CM

## 2020-03-31 NOTE — Progress Notes (Signed)
Subjective:    Patient ID: Kristine Taylor, female    DOB: [birth year], 29 y.o.   MRN: [id number]  HPI Here for continuing left sided posterior headache which started 5 days ago. She describes this as feeling like a pressure more than a pain. She still has light sensitivity and she has trouble looking at screens, such as those on cell phones or computers. She had been nauseated but not today. We saw her on 03-29-20 and gave her Imitrex to try, thinking this may be a migraine process. However the Imitrex caused her to have pain in the jaws and across the shoulders, so she only took this one time. No new neurologic deficits since then. Virtual Visit via Video Note  I connected with the patient on 03/31/20 at  1:45 PM EDT by a video enabled telemedicine application and verified that I am speaking with the correct person using two identifiers.  Location patient: home Location provider:work or home office Persons participating in the virtual visit: patient, provider  I discussed the limitations of evaluation and management by telemedicine and the availability of in person appointments. The patient expressed understanding and agreed to proceed.   HPI:    ROS: See pertinent positives and negatives per HPI.  Past Medical History:  Diagnosis Date  . Arthritis   . Asthma   . Celiac disease?    Permissive genes not present [facility]  . Chronic kidney disease   . Esophageal dysmotility   . GERD (gastroesophageal reflux disease)   . History of stomach ulcers   . Hypothyroidism   . Shingles   . Vaginal Pap smear, abnormal     Past Surgical History:  Procedure Laterality Date  . ESOPHAGEAL MANOMETRY  2017  . ESOPHAGOGASTRODUODENOSCOPY  12/2015  . LEEP    . NASAL SEPTUM SURGERY    . NOSE REPAIR  2006  . [place] IMPEDANCE STUDY  2017    Family History  Problem Relation Age of Onset  . Graves' disease Mother   . Colon polyps Mother   . Hyperlipidemia Father   .  Hypertension Father   . Healthy Sister   . Graves' disease Maternal Grandfather      Current Outpatient Medications:  .  cephALEXin (KEFLEX) 250 MG capsule, Take 1 capsule (250 mg total) by mouth daily., Disp: 30 capsule, Rfl: 1 .  cyclobenzaprine (FLEXERIL) 5 MG tablet, Take 1 tablet (5 mg total) by mouth 3 (three) times daily as needed for muscle spasms., Disp: 30 tablet, Rfl: 0 .  desmopressin (DDAVP NASAL) 0.01 % solution, Place 1 spray (10 mcg total) into the nose at bedtime., Disp: 5 mL, Rfl: 12 .  dexlansoprazole (DEXILANT) 60 MG capsule, Take 1 capsule (60 mg total) by mouth daily., Disp: 90 capsule, Rfl: 3 .  docusate sodium (COLACE) 100 MG capsule, Take 100 mg by mouth daily as needed for mild constipation., Disp: , Rfl:  .  famotidine (PEPCID) 20 MG tablet, TAKE 1 TABLET BY MOUTH TWICE A DAY, Disp: 180 tablet, Rfl: 1 .  Fe Cbn-Fe Gluc-FA-B12-C-DSS (FERRALET 90) 90-1 MG TABS, Take 1 tablet by mouth daily., Disp: , Rfl:  .  ibuprofen (ADVIL,MOTRIN) 800 MG tablet, Take 1 tablet (800 mg total) by mouth 3 (three) times daily., Disp: 21 tablet, Rfl: 0 .  levothyroxine (SYNTHROID, LEVOTHROID) 75 MCG tablet, Take 75-150 mcg by mouth See admin instructions. Taking 99mg on Mon, Tues, Thur, Friday and taking 1538m on Wed, Saturday and Sunday, Disp: ,  Rfl:  .  Prenatal Vit-Fe Fumarate-FA (MULTIVITAMIN-PRENATAL) 27-0.8 MG TABS tablet, Take 1 tablet by mouth daily at 12 noon., Disp: , Rfl:  .  Probiotic Product (PROBIOTIC DAILY PO), Take 1 tablet by mouth daily. , Disp: , Rfl:   EXAM:  VITALS per patient if applicable:  GENERAL: alert, oriented, appears well and in no acute distress  HEENT: atraumatic, conjunttiva clear, no obvious abnormalities on inspection of external nose and ears  NECK: normal movements of the head and neck  LUNGS: on inspection no signs of respiratory distress, breathing rate appears normal, no obvious gross SOB, gasping or wheezing  CV: no obvious cyanosis  MS:  moves all visible extremities without noticeable abnormality  PSYCH/NEURO: pleasant and cooperative, no obvious depression or anxiety, speech and thought processing grossly intact  ASSESSMENT AND PLAN: Intractable headache. We will set up a non-contrasted head CT asap and we will arrange for her to see Neurology asap. She will let us know if anything changes.  Alysia Penna, MD  Discussed the following assessment and plan:  Acute intractable headache, unspecified headache type - Plan: CT Head Wo Contrast, Ambulatory referral to Neurology     I discussed the assessment and treatment plan with the patient. The patient was provided an opportunity to ask questions and all were answered. The patient agreed with the plan and demonstrated an understanding of the instructions.   The patient was advised to call back or seek an in-person evaluation if the symptoms worsen or if the condition fails to improve as anticipated.     Review of Systems     Objective:   Physical Exam        Assessment & Plan:

## 2020-04-03 ENCOUNTER — Other Ambulatory Visit: Payer: Self-pay | Admitting: Family Medicine

## 2020-04-05 ENCOUNTER — Ambulatory Visit
Admission: RE | Admit: 2020-04-05 | Discharge: 2020-04-05 | Disposition: A | Discharge status: Home / Self Care | Payer: BC Managed Care – PPO | Source: Ambulatory Visit | Attending: Family Medicine | Admitting: Family Medicine

## 2020-04-05 ENCOUNTER — Other Ambulatory Visit: Payer: BC Managed Care – PPO

## 2020-04-05 ENCOUNTER — Other Ambulatory Visit: Payer: Self-pay

## 2020-04-05 DIAGNOSIS — R519 Headache, unspecified: Secondary | ICD-10-CM

## 2020-04-06 ENCOUNTER — Telehealth (INDEPENDENT_AMBULATORY_CARE_PROVIDER_SITE_OTHER): Payer: BC Managed Care – PPO | Admitting: Family Medicine

## 2020-04-06 ENCOUNTER — Encounter: Payer: Self-pay | Admitting: Family Medicine

## 2020-04-06 DIAGNOSIS — R519 Headache, unspecified: Secondary | ICD-10-CM | POA: Diagnosis not present

## 2020-04-06 MED ORDER — METOCLOPRAMIDE HCL 10 MG PO TABS: ORAL_TABLET | ORAL | 0 refills | Status: DC

## 2020-04-06 NOTE — Progress Notes (Signed)
Virtual Visit via Video Note  I connected with Kristine Taylor on 04/06/20 at  4:00 PM EDT by a video enabled telemedicine application 2/2 [id number] pandemic and verified that I am speaking with the correct person using two identifiers.  Location patient: home Location provider:work or home office Persons participating in the virtual visit: patient, provider  I discussed the limitations of evaluation and management by telemedicine and the availability of in person appointments. The patient expressed understanding and agreed to proceed.   HPI: Pt started seeing "prisims and rainbow lines".  She went to bed and woke up with a HA.  Pt was seen 8/11 in clinic by Dr. Sarajane Jews, given imitrex.  Had "a bad rxn to imitrex"-pain in jaw and across shoulders.  Seen again 8/13, CT head ordered.  CT done 8/18 and negative.  Appt with Neuro in Nov 2021.  Pt notes continued HA.  Has a HA now, more pressure than pain, in center of head moving down into frontal area/ forehead.   Pt notes L pupil larger than R since pt had her son in Jan.  Pt breastfeeding.  Also notes sensitivity to meds, cause a longer lasting drowsy effect for pt.  Pt does not drink caffeine.  ROS: See pertinent positives and negatives per HPI.  Past Medical History:  Diagnosis Date  . Arthritis   . Asthma   . Celiac disease?    Permissive genes not present [facility]  . Chronic kidney disease   . Esophageal dysmotility   . GERD (gastroesophageal reflux disease)   . History of stomach ulcers   . Hypothyroidism   . Shingles   . Vaginal Pap smear, abnormal     Past Surgical History:  Procedure Laterality Date  . ESOPHAGEAL MANOMETRY  2017  . ESOPHAGOGASTRODUODENOSCOPY  12/2015  . LEEP    . NASAL SEPTUM SURGERY    . NOSE REPAIR  2006  . [place] IMPEDANCE STUDY  2017    Family History  Problem Relation Age of Onset  . Graves' disease Mother   . Colon polyps Mother   . Hyperlipidemia Father   . Hypertension Father   . Healthy  Sister   . Graves' disease Maternal Grandfather     Current Outpatient Medications:  .  desmopressin (DDAVP NASAL) 0.01 % solution, Place 1 spray (10 mcg total) into the nose at bedtime., Disp: 5 mL, Rfl: 12 .  dexlansoprazole (DEXILANT) 60 MG capsule, Take 1 capsule (60 mg total) by mouth daily., Disp: 90 capsule, Rfl: 3 .  famotidine (PEPCID) 20 MG tablet, TAKE 1 TABLET BY MOUTH TWICE A DAY, Disp: 180 tablet, Rfl: 1 .  Fe Cbn-Fe Gluc-FA-B12-C-DSS (FERRALET 90) 90-1 MG TABS, Take 1 tablet by mouth daily., Disp: , Rfl:  .  levothyroxine (SYNTHROID, LEVOTHROID) 75 MCG tablet, Take 75-150 mcg by mouth See admin instructions. Taking 50mg on Mon, Tues, Thur, Friday and taking 1532m on Wed, Saturday and Sunday, Disp: , Rfl:  .  Prenatal Vit-Fe Fumarate-FA (MULTIVITAMIN-PRENATAL) 27-0.8 MG TABS tablet, Take 1 tablet by mouth daily at 12 noon., Disp: , Rfl:  .  Probiotic Product (PROBIOTIC DAILY PO), Take 1 tablet by mouth daily. , Disp: , Rfl:   EXAM:  VITALS per patient if applicable: RR between 1201-02pm  GENERAL: alert, oriented, appears well and in no acute distress  HEENT: atraumatic, conjunctiva clear, no obvious abnormalities on inspection of external nose and ears.  Unable to clearly see pupils 2/2 video.  NECK: normal movements  of the head and neck  LUNGS: on inspection no signs of respiratory distress, breathing rate appears normal, no obvious gross SOB, gasping or wheezing  CV: no obvious cyanosis  MS: moves all visible extremities without noticeable abnormality  PSYCH/NEURO: pleasant and cooperative, no obvious depression or anxiety, speech and thought processing grossly intact  ASSESSMENT AND PLAN:  Discussed the following assessment and plan:  Acute intractable headache, unspecified headache type  -discussed various HA treatment options taking into consideration patient is still breast-feeding.  Also note patient is sensitive to medications--meds that cause drowsiness  typically cause prolonged drowsiness for pt. -As Imitrex caused issues will avoid triptans for now -Discussed Reglan as needed.   -Can also take tylenol.  Hydration encouraged. -Patient advised if headache continues to come to clinic in a.m. for Toradol injection -will see if Neuro appt can be moved up. - Plan: metoCLOPramide (REGLAN) 10 MG tablet   I discussed the assessment and treatment plan with the patient. The patient was provided an opportunity to ask questions and all were answered. The patient agreed with the plan and demonstrated an understanding of the instructions.   The patient was advised to call back or seek an in-person evaluation if the symptoms worsen or if the condition fails to improve as anticipated.   Billie Ruddy, MD

## 2020-04-11 ENCOUNTER — Encounter: Payer: Self-pay | Admitting: Family Medicine

## 2020-04-26 DIAGNOSIS — E039 Hypothyroidism, unspecified: Secondary | ICD-10-CM | POA: Diagnosis not present

## 2020-05-01 DIAGNOSIS — E232 Diabetes insipidus: Secondary | ICD-10-CM | POA: Diagnosis not present

## 2020-05-01 DIAGNOSIS — E875 Hyperkalemia: Secondary | ICD-10-CM | POA: Diagnosis not present

## 2020-05-01 DIAGNOSIS — R631 Polydipsia: Secondary | ICD-10-CM | POA: Diagnosis not present

## 2020-05-01 DIAGNOSIS — E039 Hypothyroidism, unspecified: Secondary | ICD-10-CM | POA: Diagnosis not present

## 2020-05-10 DIAGNOSIS — E875 Hyperkalemia: Secondary | ICD-10-CM | POA: Diagnosis not present

## 2020-05-22 DIAGNOSIS — Z6820 Body mass index (BMI) 20.0-20.9, adult: Secondary | ICD-10-CM | POA: Diagnosis not present

## 2020-05-22 DIAGNOSIS — R3589 Other polyuria: Secondary | ICD-10-CM | POA: Diagnosis not present

## 2020-05-22 DIAGNOSIS — E232 Diabetes insipidus: Secondary | ICD-10-CM | POA: Insufficient documentation

## 2020-05-22 DIAGNOSIS — E875 Hyperkalemia: Secondary | ICD-10-CM | POA: Diagnosis not present

## 2020-05-24 DIAGNOSIS — R3589 Other polyuria: Secondary | ICD-10-CM | POA: Diagnosis not present

## 2020-06-07 DIAGNOSIS — Z23 Encounter for immunization: Secondary | ICD-10-CM | POA: Diagnosis not present

## 2020-06-20 ENCOUNTER — Encounter: Payer: Self-pay | Admitting: Family Medicine
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
## 2020-06-21 DIAGNOSIS — E875 Hyperkalemia: Secondary | ICD-10-CM | POA: Diagnosis not present

## 2020-06-21 NOTE — Telephone Encounter (Signed)
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

## 2020-06-22 ENCOUNTER — Ambulatory Visit: Payer: BC Managed Care – PPO | Admitting: Neurology

## 2020-06-22 ENCOUNTER — Other Ambulatory Visit: Payer: Self-pay

## 2020-06-22 ENCOUNTER — Encounter: Payer: Self-pay | Admitting: Neurology

## 2020-06-22 VITALS — BP 108/69 | HR 74 | Resp 18 | Ht 65.0 in | Wt 122.0 lb

## 2020-06-22 DIAGNOSIS — G43109 Migraine with aura, not intractable, without status migrainosus: Secondary | ICD-10-CM | POA: Diagnosis not present

## 2020-06-22 MED ORDER — ELETRIPTAN HYDROBROMIDE 40 MG PO TABS: ORAL_TABLET | ORAL | 5 refills | Status: DC

## 2020-06-22 NOTE — Patient Instructions (Addendum)
  1. Take eletriptan 44m at earliest onset of headache.  May repeat dose once in 2 hours if needed.  Maximum 2 tablets in 24 hours.  Consider holding breastfeeding for 24 hours after dose 2. Limit use of pain relievers to no more than 2 days out of the week.  These medications include acetaminophen, NSAIDs (ibuprofen/Advil/Motrin, naproxen/Aleve, triptans (Imitrex/sumatriptan), Excedrin, and narcotics.  This will help reduce risk of rebound headaches. 3. Be aware of common food triggers:  - Caffeine:  coffee, black tea, cola, Mt. Dew  - Chocolate  - Dairy:  aged cheeses (brie, blue, cheddar, gouda, [place] provolone, [place] Swiss, etc), chocolate milk, buttermilk, sour cream, limit eggs and yogurt  - Nuts, peanut butter  - Alcohol  - Cereals/grains:  FRESH breads (fresh bagels, sourdough, doughnuts), yeast productions  - Processed/canned/aged/cured meats (pre-packaged deli meats, hotdogs)  - MSG/glutamate:  soy sauce, flavor enhancer, pickled/preserved/marinated foods  - Sweeteners:  aspartame (Equal, Nutrasweet).  Sugar and Splenda are okay  - Vegetables:  legumes (lima beans, lentils, snow peas, fava beans, pinto peans, peas, garbanzo beans), sauerkraut, onions, olives, pickles  - Fruit:  avocados, bananas, citrus fruit (orange, lemon, grapefruit), mango  - Other:  Frozen meals, macaroni and cheese 4. Routine exercise 5. Stay adequately hydrated (aim for 64 oz water daily) 6. Keep headache diary 7. Maintain proper stress management 8. Maintain proper sleep hygiene 9. Do not skip meals 10. Consider supplements:  magnesium citrate 406mdaily three times daily.

## 2020-06-23 ENCOUNTER — Telehealth (INDEPENDENT_AMBULATORY_CARE_PROVIDER_SITE_OTHER): Payer: BC Managed Care – PPO | Admitting: Family Medicine

## 2020-06-23 ENCOUNTER — Encounter: Payer: Self-pay | Admitting: Family Medicine

## 2020-06-23 DIAGNOSIS — F4000 Agoraphobia, unspecified: Secondary | ICD-10-CM | POA: Diagnosis not present

## 2020-06-23 DIAGNOSIS — F40243 Fear of flying: Secondary | ICD-10-CM | POA: Diagnosis not present

## 2020-06-23 MED ORDER — LORAZEPAM 0.5 MG PO TABS: ORAL_TABLET | ORAL | 0 refills | Status: DC

## 2020-06-23 NOTE — Progress Notes (Signed)
Virtual Visit via Video Note  I connected with Kristine Taylor on 06/23/20 at  4:00 PM EDT by a video enabled telemedicine application 2/2 PJASN-05 pandemic and verified that I am speaking with the correct person using two identifiers.  Location patient: home Location provider:work or home office Persons participating in the virtual visit: patient, provider  I discussed the limitations of evaluation and management by telemedicine and the availability of in person appointments. The patient expressed understanding and agreed to proceed.   HPI: Pt is a 29 yo female with pmh sig for hypothyroidism, GERD esophageal dysmotility, arthritis, asthma who was seen for acute concern.  Patient endorses anxiety when out in crowds as she feels like she will not be able to escape.  Patient has an upcoming trip to [place] planned for her 29th birthday.  Patient not nervous she will have anxiety on the plane as she has not traveled in a while 2/2 COVID-19 pandemic.  This will be her first trip with her son.  In the past patient has required Ativan prior to imaging studies.   ROS: See pertinent positives and negatives per HPI.  Past Medical History:  Diagnosis Date  . Arthritis   . Asthma   . Celiac disease?    Permissive genes not present on [facility]  . Chronic kidney disease   . Esophageal dysmotility   . GERD (gastroesophageal reflux disease)   . History of stomach ulcers   . Hypothyroidism   . Shingles   . Vaginal Pap smear, abnormal     Past Surgical History:  Procedure Laterality Date  . ESOPHAGEAL MANOMETRY  2017  . ESOPHAGOGASTRODUODENOSCOPY  12/2015  . LEEP    . NASAL SEPTUM SURGERY    . NOSE REPAIR  2006  . [place] IMPEDANCE STUDY  2017    Family History  Problem Relation Age of Onset  . Graves' disease Mother   . Colon polyps Mother   . Hyperlipidemia Father   . Hypertension Father   . Healthy Sister   . Graves' disease Maternal Grandfather     Current Outpatient  Medications:  .  desmopressin (DDAVP NASAL) 0.01 % solution, Place 1 spray (10 mcg total) into the nose at bedtime., Disp: 5 mL, Rfl: 12 .  dexlansoprazole (DEXILANT) 60 MG capsule, Take 1 capsule (60 mg total) by mouth daily., Disp: 90 capsule, Rfl: 3 .  eletriptan (RELPAX) 40 MG tablet, Take 1 tablet earliest onset of headache.  May repeat in 2 hours if headache persists or recurs.  Maximum 2 tablets in 24 hours., Disp: 10 tablet, Rfl: 5 .  famotidine (PEPCID) 20 MG tablet, TAKE 1 TABLET BY MOUTH TWICE A DAY, Disp: 180 tablet, Rfl: 1 .  Fe Cbn-Fe Gluc-FA-B12-C-DSS (FERRALET 90) 90-1 MG TABS, Take 1 tablet by mouth daily. , Disp: , Rfl:  .  levothyroxine (SYNTHROID, LEVOTHROID) 75 MCG tablet, Take 75-150 mcg by mouth See admin instructions. Taking 38mg on Mon, Tues, Thur, Friday and taking 1529m on Wed, Saturday and Sunday, Disp: , Rfl:  .  metoCLOPramide (REGLAN) 10 MG tablet, Take 1 tab by mouth once daily as needed for migraine., Disp: 10 tablet, Rfl: 0 .  Prenatal Vit-Fe Fumarate-FA (MULTIVITAMIN-PRENATAL) 27-0.8 MG TABS tablet, Take 1 tablet by mouth daily at 12 noon., Disp: , Rfl:  .  Probiotic Product (PROBIOTIC DAILY PO), Take 1 tablet by mouth daily. , Disp: , Rfl:   EXAM:  VITALS per patient if applicable:  GENERAL: alert, oriented, appears well  and in no acute distress  HEENT: atraumatic, conjunctiva clear, no obvious abnormalities on inspection of external nose and ears  NECK: normal movements of the head and neck  LUNGS: on inspection no signs of respiratory distress, breathing rate appears normal, no obvious gross SOB, gasping or wheezing  CV: no obvious cyanosis  MS: moves all visible extremities without noticeable abnormality  PSYCH/NEURO: pleasant and cooperative, no obvious depression or anxiety, speech and thought processing grossly intact  ASSESSMENT AND PLAN:  Discussed the following assessment and plan:  Fear of flying -PHQ-9 score 0 -GAD-7 score 0 - Plan:  LORazepam (ATIVAN) 0.5 MG tablet  Agoraphobia  Follow-up as needed   I discussed the assessment and treatment plan with the patient. The patient was provided an opportunity to ask questions and all were answered. The patient agreed with the plan and demonstrated an understanding of the instructions.   The patient was advised to call back or seek an in-person evaluation if the symptoms worsen or if the condition fails to improve as anticipated.   Billie Ruddy, MD

## 2020-07-08 ENCOUNTER — Other Ambulatory Visit: Payer: BC Managed Care – PPO

## 2020-07-08 DIAGNOSIS — Z20822 Contact with and (suspected) exposure to covid-19: Secondary | ICD-10-CM

## 2020-07-09 LAB — SARS-COV-2, NAA 2 DAY TAT

## 2020-07-09 LAB — NOVEL CORONAVIRUS, NAA: SARS-CoV-2, NAA: NOT DETECTED

## 2020-07-26 DIAGNOSIS — E039 Hypothyroidism, unspecified: Secondary | ICD-10-CM | POA: Diagnosis not present

## 2020-07-26 DIAGNOSIS — R3589 Other polyuria: Secondary | ICD-10-CM | POA: Diagnosis not present

## 2020-08-28 DIAGNOSIS — E039 Hypothyroidism, unspecified: Secondary | ICD-10-CM | POA: Diagnosis not present

## 2020-09-18 ENCOUNTER — Other Ambulatory Visit: Payer: Self-pay

## 2020-09-18 ENCOUNTER — Encounter: Payer: Self-pay | Admitting: Family Medicine

## 2020-09-18 ENCOUNTER — Ambulatory Visit: Payer: BC Managed Care – PPO | Admitting: Family Medicine

## 2020-09-18 ENCOUNTER — Other Ambulatory Visit: Payer: BC Managed Care – PPO

## 2020-09-18 VITALS — BP 98/68 | HR 83 | Temp 98.6°F | Ht 65.0 in | Wt 121.4 lb

## 2020-09-18 DIAGNOSIS — R197 Diarrhea, unspecified: Secondary | ICD-10-CM

## 2020-09-18 DIAGNOSIS — R1030 Lower abdominal pain, unspecified: Secondary | ICD-10-CM

## 2020-09-18 DIAGNOSIS — Z1152 Encounter for screening for COVID-19: Secondary | ICD-10-CM | POA: Diagnosis not present

## 2020-09-18 LAB — LIPASE: Lipase: 35 U/L (ref 11.0–59.0)

## 2020-09-18 LAB — CBC WITH DIFFERENTIAL/PLATELET
Basophils Absolute: 0 10*3/uL (ref 0.0–0.1)
Basophils Relative: 0.8 % (ref 0.0–3.0)
Eosinophils Absolute: 0.1 10*3/uL (ref 0.0–0.7)
Eosinophils Relative: 1.8 % (ref 0.0–5.0)
HCT: 41.5 % (ref 36.0–46.0)
Hemoglobin: 14.4 g/dL (ref 12.0–15.0)
Lymphocytes Relative: 35.5 % (ref 12.0–46.0)
Lymphs Abs: 1.8 10*3/uL (ref 0.7–4.0)
MCHC: 34.7 g/dL (ref 30.0–36.0)
MCV: 89.1 fl (ref 78.0–100.0)
Monocytes Absolute: 0.5 10*3/uL (ref 0.1–1.0)
Monocytes Relative: 10.2 % (ref 3.0–12.0)
Neutro Abs: 2.5 10*3/uL (ref 1.4–7.7)
Neutrophils Relative %: 51.7 % (ref 43.0–77.0)
Platelets: 261 10*3/uL (ref 150.0–400.0)
RBC: 4.66 Mil/uL (ref 3.87–5.11)
RDW: 12.9 % (ref 11.5–15.5)
WBC: 4.9 10*3/uL (ref 4.0–10.5)

## 2020-09-18 LAB — POC URINALSYSI DIPSTICK (AUTOMATED)
Bilirubin, UA: NEGATIVE
Blood, UA: NEGATIVE
Glucose, UA: NEGATIVE
Ketones, UA: NEGATIVE
Leukocytes, UA: NEGATIVE
Nitrite, UA: NEGATIVE
Protein, UA: NEGATIVE
Spec Grav, UA: 1.01 (ref 1.010–1.025)
Urobilinogen, UA: 0.2 E.U./dL
pH, UA: 6.5 (ref 5.0–8.0)

## 2020-09-18 LAB — COMPREHENSIVE METABOLIC PANEL
ALT: 13 U/L (ref 0–35)
AST: 15 U/L (ref 0–37)
Albumin: 4.4 g/dL (ref 3.5–5.2)
Alkaline Phosphatase: 63 U/L (ref 39–117)
BUN: 12 mg/dL (ref 6–23)
CO2: 30 mEq/L (ref 19–32)
Calcium: 9.7 mg/dL (ref 8.4–10.5)
Chloride: 106 mEq/L (ref 96–112)
Creatinine, Ser: 0.62 mg/dL (ref 0.40–1.20)
GFR: 119.67 mL/min (ref >60.00)
Glucose, Bld: 85 mg/dL (ref 70–99)
Potassium: 4.8 mEq/L (ref 3.5–5.1)
Sodium: 141 mEq/L (ref 135–145)
Total Bilirubin: 0.5 mg/dL (ref 0.2–1.2)
Total Protein: 6.7 g/dL (ref 6.0–8.3)

## 2020-09-18 LAB — TSH: TSH: 1.61 u[IU]/mL (ref 0.35–4.50)

## 2020-09-18 NOTE — Patient Instructions (Addendum)
Food Choices to Help Relieve Diarrhea, Adult Diarrhea can make you feel weak and cause you to become dehydrated. It is important to choose the right foods and drinks to:  Relieve diarrhea.  Replace lost fluids and nutrients.  Prevent dehydration. What are tips for following this plan? Relieving diarrhea  Avoid foods that make your diarrhea worse. These may include: ? Foods and beverages sweetened with high-fructose corn syrup, honey, or sweeteners such as xylitol, sorbitol, and mannitol. ? Fried, greasy, or spicy foods. ? Raw fruits and vegetables.  Eat foods that are rich in probiotics. These include foods such as yogurt and fermented milk products. Probiotics can help increase healthy bacteria in your stomach and intestines (gastrointestinal tract or GI tract). This may help digestion and stop diarrhea.  If you have lactose intolerance, avoid dairy products. These may make your diarrhea worse.  Take medicine to help stop diarrhea only as told by your health care provider. Replacing nutrients  Eat bland, easy-to-digest foods in small amounts as you are able, until your diarrhea starts to get better. These foods include bananas, applesauce, rice, toast, and crackers.  Gradually reintroduce nutrient-rich foods as tolerated or as told by your health care provider. This includes: ? Well-cooked protein foods, such as eggs, lean meats like fish or chicken without skin, and tofu. ? Peeled, seeded, and soft-cooked fruits and vegetables. ? Low-fat dairy products. ? Whole grains.  Take vitamin and mineral supplements as told by your health care provider.   Preventing dehydration  Start by sipping water or a solution to prevent dehydration (oral rehydration solution, ORS). This is a drink that helps replace fluids and minerals your body has lost. You can buy an ORS at pharmacies and retail stores.  Try to drink at least 8-10 cups (2,000-2,500 mL) of fluid each day to help replace lost  fluids. If you have urine that is pale yellow, you are getting enough fluids.  You may drink other liquids in addition to water, such as fruit juice that you have added water to (diluted fruit juice) or low-calorie sports drinks, as tolerated or as told by your health care provider.  Avoid drinks with caffeine, such as coffee, tea, or soft drinks.  Avoid alcohol.   Summary  When you have diarrhea, it is important to choose the right foods and drinks to relieve diarrhea, to replace lost fluids and nutrients, and to prevent dehydration.  Make sure you drink enough fluid to keep your urine pale yellow.  You may benefit from eating bland foods at first. Gradually reintroduce healthy, nutrient-rich foods as tolerated or as told by your health care provider.  Avoid foods that make your diarrhea worse, such as fried, greasy, or spicy foods. This information is not intended to replace advice given to you by your health care provider. Make sure you discuss any questions you have with your health care provider. Document Revised: 09/21/2019 Document Reviewed: 09/21/2019 Elsevier Patient Education  2021 [place].  Diarrhea, Adult Diarrhea is frequent loose and watery bowel movements. Diarrhea can make you feel weak and cause you to become dehydrated. Dehydration can make you tired and thirsty, cause you to have a dry mouth, and decrease how often you urinate. Diarrhea typically lasts 2-3 days. However, it can last longer if it is a sign of something more serious. It is important to treat your diarrhea as told by your health care provider. Follow these instructions at home: Eating and drinking Follow these recommendations as told by your health  care provider:  Take an oral rehydration solution (ORS). This is an over-the-counter medicine that helps return your body to its normal balance of nutrients and water. It is found at pharmacies and retail stores.  Drink plenty of fluids, such as water,  ice chips, diluted fruit juice, and low-calorie sports drinks. You can drink milk also, if desired.  Avoid drinking fluids that contain a lot of sugar or caffeine, such as energy drinks, sports drinks, and soda.  Eat bland, easy-to-digest foods in small amounts as you are able. These foods include bananas, applesauce, rice, lean meats, toast, and crackers.  Avoid alcohol.  Avoid spicy or fatty foods.      Medicines  Take over-the-counter and prescription medicines only as told by your health care provider.  If you were prescribed an antibiotic medicine, take it as told by your health care provider. Do not stop using the antibiotic even if you start to feel better. General instructions  Wash your hands often using soap and water. If soap and water are not available, use a hand sanitizer. Others in the household should wash their hands as well. Hands should be washed: ? After using the toilet or changing a diaper. ? Before preparing, cooking, or serving food. ? While caring for a sick person or while visiting someone in a hospital.  Drink enough fluid to keep your urine pale yellow.  Rest at home while you recover.  Watch your condition for any changes.  Take a warm bath to relieve any burning or pain from frequent diarrhea episodes.  Keep all follow-up visits as told by your health care provider. This is important.   Contact a health care provider if:  You have a fever.  Your diarrhea gets worse.  You have new symptoms.  You cannot keep fluids down.  You feel light-headed or dizzy.  You have a headache.  You have muscle cramps. Get help right away if:  You have chest pain.  You feel extremely weak or you faint.  You have bloody or black stools or stools that look like tar.  You have severe pain, cramping, or bloating in your abdomen.  You have trouble breathing or you are breathing very quickly.  Your heart is beating very quickly.  Your skin feels cold and  clammy.  You feel confused.  You have signs of dehydration, such as: ? Dark urine, very little urine, or no urine. ? Cracked lips. ? Dry mouth. ? Sunken eyes. ? Sleepiness. ? Weakness. Summary  Diarrhea is frequent loose and watery bowel movements. Diarrhea can make you feel weak and cause you to become dehydrated.  Drink enough fluids to keep your urine pale yellow.  Make sure that you wash your hands after using the toilet. If soap and water are not available, use hand sanitizer.  Contact a health care provider if your diarrhea gets worse or you have new symptoms.  Get help right away if you have signs of dehydration. This information is not intended to replace advice given to you by your health care provider. Make sure you discuss any questions you have with your health care provider. Document Revised: 12/22/2018 Document Reviewed: 01/09/2018 Elsevier Patient Education  2021 [employer].

## 2020-09-18 NOTE — Progress Notes (Signed)
Subjective:    Patient ID: Kristine Taylor, female    DOB: 1991/03/19, 29 y.o.   MRN: [id number]  No chief complaint on file.   HPI Patient is a 30 yo female with pmh sig for celiac disease, acquired hypothyroidism, agoraphobia, arthritis,  currently breast-feeding who was seen today for acute concern.  Patient concerned about possible UTI.  Pt's son was sick last Wednesday with a "tummy bug".  On Thursday, pt developed back pain, pain/achiness in lower abdomen, diarrhea (x20 loose stools), chills, nausea, odor in urine.  An at home Azo urine test was positive for leuks.  Pt also noted white sediment/particles in urine.  Started feeling somewhat better over the weekend.  Home pregnancy test was negative. Pt currently breastfeeding, no LMP since birth of her son.  Patient started increasing her intake of fluids by drinking Pedialyte.    Past Medical History:  Diagnosis Date  . Arthritis   . Asthma   . Celiac disease?    Permissive genes not present [facility]  . Chronic kidney disease   . Esophageal dysmotility   . GERD (gastroesophageal reflux disease)   . History of stomach ulcers   . Hypothyroidism   . Shingles   . Vaginal Pap smear, abnormal     Allergies  Allergen Reactions  . Gluten Meal   . Imitrex [Sumatriptan]     Jaw and shoulder pain     ROS General: Denies fever, chills, night sweats, changes in weight, changes in appetite + decreased appetite, chills HEENT: Denies headaches, ear pain, changes in vision, rhinorrhea, sore throat CV: Denies CP, palpitations, SOB, orthopnea Pulm: Denies SOB, cough, wheezing GI: Denies abdominal pain, nausea, vomiting, diarrhea, constipation  + diarrhea, nausea, lower abdominal pain GU: Denies dysuria, hematuria, frequency, vaginal discharge  + odor to urine Msk: Denies muscle cramps, joint pains  + back pain Neuro: Denies weakness, numbness, tingling  Skin: Denies rashes, bruising Psych: Denies depression, anxiety,  hallucinations      Objective:    Blood pressure 98/68, pulse 83, temperature 98.6 F (37 C), temperature source Oral, height 5' 5"  (1.651 m), weight 121 lb 6.4 oz (55.1 kg), SpO2 97 %, currently breastfeeding.  Gen. Pleasant, well-nourished, in no distress, normal affect   HEENT: [place]/AT, face symmetric, conjunctiva clear, no scleral icterus, PERRLA, EOMI, nares patent without drainage Lungs: no accessory muscle use Cardiovascular: RRR, no m/r/g, no peripheral edema Musculoskeletal: No deformities, no cyanosis or clubbing, normal tone Neuro:  A&Ox3, CN II-XII intact, normal gait Skin:  Warm, no lesions/ rash   Wt Readings from Last 3 Encounters:  06/22/20 122 lb (55.3 kg)  03/29/20 123 lb 9.6 oz (56.1 kg)  01/15/20 125 lb (56.7 kg)    Lab Results  Component Value Date   WBC 11.0 (H) 07/24/2019   HGB 11.8 (L) 07/24/2019   HCT 34.9 (L) 07/24/2019   PLT 192 07/24/2019   GLUCOSE 87 11/13/2018   ALT 15 11/13/2018   AST 16 11/13/2018   NA 138 11/13/2018   K 4.8 11/13/2018   CL 104 11/13/2018   CREATININE 0.75 11/13/2018   BUN 12 11/13/2018   CO2 28 11/13/2018   TSH 2.40 03/30/2010    Assessment/Plan:  Lower abdominal pain -Possible causes including cramping from enteritis, UTI, ovarian cyst, appendicitis -UA negative -Obtain urine culture and labs -Given precautions - Plan: POCT Urinalysis Dipstick (Automated), Urine Culture, COVID-19, Flu A+B and RSV, CBC with Differential/Platelet, Comprehensive metabolic panel, Lipase  Encounter for screening  for COVID-19  -Discussed signs and symptoms of COVID-19 -Given diarrhea will test for Covid. -Given precautions - Plan: COVID-19, Flu A+B and RSV  Diarrhea, unspecified type -Discussed possible causes including viral enteritis -Discussed supportive care -Advance diet as tolerated - Plan: CBC with Differential/Platelet, Comprehensive metabolic panel, Lipase  F/u as needed  Grier Mitts, MD

## 2020-09-19 LAB — URINE CULTURE
MICRO NUMBER:: 11475642
Result:: NO GROWTH
SPECIMEN QUALITY:: ADEQUATE

## 2020-09-20 LAB — COVID-19, FLU A+B AND RSV
Influenza A, NAA: NOT DETECTED
Influenza B, NAA: NOT DETECTED
RSV, NAA: NOT DETECTED
SARS-CoV-2, NAA: NOT DETECTED

## 2020-11-09 DIAGNOSIS — E063 Autoimmune thyroiditis: Secondary | ICD-10-CM | POA: Insufficient documentation

## 2020-11-09 DIAGNOSIS — Z9889 Other specified postprocedural states: Secondary | ICD-10-CM | POA: Insufficient documentation

## 2020-11-09 DIAGNOSIS — B977 Papillomavirus as the cause of diseases classified elsewhere: Secondary | ICD-10-CM | POA: Insufficient documentation

## 2020-11-10 DIAGNOSIS — E039 Hypothyroidism, unspecified: Secondary | ICD-10-CM | POA: Diagnosis not present

## 2020-11-15 DIAGNOSIS — Z01419 Encounter for gynecological examination (general) (routine) without abnormal findings: Secondary | ICD-10-CM | POA: Diagnosis not present

## 2020-11-15 DIAGNOSIS — Z6820 Body mass index (BMI) 20.0-20.9, adult: Secondary | ICD-10-CM | POA: Diagnosis not present

## 2020-11-15 DIAGNOSIS — Z3202 Encounter for pregnancy test, result negative: Secondary | ICD-10-CM | POA: Diagnosis not present

## 2020-11-28 ENCOUNTER — Ambulatory Visit: Payer: BC Managed Care – PPO | Admitting: Internal Medicine

## 2020-11-28 ENCOUNTER — Encounter: Payer: Self-pay | Admitting: Internal Medicine

## 2020-11-28 VITALS — BP 102/64 | HR 63 | Ht 65.0 in | Wt 122.2 lb

## 2020-11-28 DIAGNOSIS — K219 Gastro-esophageal reflux disease without esophagitis: Secondary | ICD-10-CM | POA: Diagnosis not present

## 2020-11-28 DIAGNOSIS — K224 Dyskinesia of esophagus: Secondary | ICD-10-CM

## 2020-11-28 DIAGNOSIS — R519 Headache, unspecified: Secondary | ICD-10-CM

## 2020-11-28 MED ORDER — METOCLOPRAMIDE HCL 10 MG PO TABS: ORAL_TABLET | ORAL | 0 refills | Status: DC

## 2020-11-28 NOTE — Patient Instructions (Addendum)
We have sent the following medications to your pharmacy for you to pick up at your convenience: Metoclopramide  Try the metoclopramide for several days and let us know if it helps your night time issues.   I appreciate the opportunity to care for you. Silvano Rusk, MD, [facility]

## 2020-11-28 NOTE — Progress Notes (Signed)
Kristine Taylor 30 y.o. [birth year] [id number]  Assessment & Plan:   Encounter Diagnoses  Name Primary?  . Gastroesophageal reflux disease, unspecified whether esophagitis present Yes  . Esophageal spasm    Complicated situation in that she seems to be a regurgitated but has some form of esophageal dysmotility.  She wants to conceive again for long-term Reglan is not a good option but I want her to try that at bedtime to see if it relieves her nocturnal symptoms of reflux/regurgitation.  As stated in the past she might be a good TIF candidate as long as she has adequate motility.  I did explain that this is an uncommon situation and that though I understand that I do not have a lot of experience with recommending fundoplications in patients with any form of dysmotility so we want to be cautious and did not make her worse or give her a new set of problems.  I think a tertiary referral could be in her best interest.  In the meantime we will regroup after I hear about her Reglan trial.  Consider up esophageal motility testing with manometry and pH impedance here versus at a tertiary center.  I appreciate the opportunity to care for this patient. CC: Kristine Ruddy, MD  Subjective:   Chief Complaint: Reflux  HPI Kristine Taylor is a 30 year old woman who has a history of reflux, esophageal dysmotility and spasm who was maintained on Dexilant, nocturnal famotidine and as needed Reglan in the past.  She was taking Reglan regularly for her regurgitation, previous work-up in [place] and [facility] see previous notes, impedance pH monitoring suggest that she was primarily somebody who had reflux with regurgitation and weakly or nonacidic regurgitation and reflux causing her symptoms.  She had been on a regimen of peppermint for her dysmotility issues as well.  She conceived a child after coming off Reglan which had raised her prolactin levels.  That oi is now 27 months old she recently weaned him.   She reports that during her pregnancy she did not have any significant esophageal symptoms she did stay on her Dexilant and famotidine.  However within the past month coincidence versus cause-and-effect since not breast-feeding anymore she is having frequent nocturnal regurgitation of fluid that is not acidic and does not burn but is a problem.  She has the head of her bed up she waits 3 hours or so after eating she does not eat chocolate use alcohol caffeine etc.  She may be having a bit of pill dysphagia she thinks.  Wt Readings from Last 3 Encounters:  11/28/20 122 lb 3.2 oz (55.4 kg)  09/18/20 121 lb 6.4 oz (55.1 kg)  06/22/20 122 lb (55.3 kg)    Allergies  Allergen Reactions  . Gluten Meal   . Imitrex [Sumatriptan]     Jaw and shoulder pain    Current Meds  Medication Sig  . dexlansoprazole (DEXILANT) 60 MG capsule Take 1 capsule (60 mg total) by mouth daily.  Marland Kitchen eletriptan (RELPAX) 40 MG tablet Take 1 tablet earliest onset of headache.  May repeat in 2 hours if headache persists or recurs.  Maximum 2 tablets in 24 hours. (Patient taking differently: Take 1 tablet earliest onset of headache.  May repeat in 2 hours if headache persists or recurs.  Maximum 2 tablets in 24 hours. As needed)  . famotidine (PEPCID) 20 MG tablet TAKE 1 TABLET BY MOUTH TWICE A DAY (Patient taking differently: Take 20 mg by mouth daily.)  .  Fe Cbn-Fe Gluc-FA-B12-C-DSS (FERRALET 90) 90-1 MG TABS Take 1 tablet by mouth daily.   Marland Kitchen levothyroxine (SYNTHROID) 88 MCG tablet Take 88 mcg by mouth See admin instructions. Taking 88 mcg daily on empty stomach  . LORazepam (ATIVAN) 0.5 MG tablet Take 30 mins prior to flying as needed.  . metoCLOPramide (REGLAN) 10 MG tablet Take 1 tab by mouth once daily as needed for migraine.  . Prenatal Vit-Fe Fumarate-FA (MULTIVITAMIN-PRENATAL) 27-0.8 MG TABS tablet Take 1 tablet by mouth daily at 12 noon.  . Probiotic Product (PROBIOTIC DAILY PO) Take 1 tablet by mouth daily.    Past  Medical History:  Diagnosis Date  . Arthritis   . Asthma   . Celiac disease?    Permissive genes not present [facility]  . Chronic kidney disease   . Esophageal dysmotility   . GERD (gastroesophageal reflux disease)   . Hashimoto's disease   . History of stomach ulcers   . Hypothyroidism   . 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 Shingles   . Vaginal Pap smear, abnormal    Past Surgical History:  Procedure Laterality Date  . ESOPHAGEAL MANOMETRY  2017  . ESOPHAGOGASTRODUODENOSCOPY  12/2015  . LEEP    . NASAL SEPTUM SURGERY    . NOSE REPAIR  2006  . 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   Review of Systems As above  Objective:   Physical Exam @BP  102/64   Pulse 63   Ht 5' 5"  (1.651 m)   Wt 122 lb 3.2 oz (55.4 kg)   BMI 20.34 kg/m @  General:  NAD Eyes:   anicteric Lungs:  clear Heart::  S1S2 no rubs, murmurs or gallops Abdomen:  soft and nontender,  Ext:   no edema, cyanosis or clubbing    Data Reviewed:  See HPI
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
## 2020-12-20 ENCOUNTER — Encounter: Payer: Self-pay | Admitting: Neurology

## 2020-12-20 ENCOUNTER — Other Ambulatory Visit: Payer: Self-pay

## 2020-12-20 ENCOUNTER — Ambulatory Visit: Payer: BC Managed Care – PPO | Admitting: Neurology

## 2020-12-20 VITALS — BP 95/63 | HR 85 | Ht 65.0 in | Wt 121.2 lb

## 2020-12-20 DIAGNOSIS — G43109 Migraine with aura, not intractable, without status migrainosus: Secondary | ICD-10-CM | POA: Diagnosis not present

## 2020-12-20 DIAGNOSIS — M542 Cervicalgia: Secondary | ICD-10-CM

## 2020-12-20 DIAGNOSIS — M5382 Other specified dorsopathies, cervical region: Secondary | ICD-10-CM | POA: Diagnosis not present

## 2020-12-20 DIAGNOSIS — G4486 Cervicogenic headache: Secondary | ICD-10-CM

## 2020-12-20 NOTE — Patient Instructions (Addendum)
1.  We'll get MRI of cervical spine.  Further recommendations pending results.  Otherwise, we'll have you follow up in 6 months.  In meantime avoid heavy lifting or exercises involving neck flexion or other neck movements.

## 2020-12-22 ENCOUNTER — Telehealth: Payer: Self-pay

## 2020-12-22 DIAGNOSIS — M542 Cervicalgia: Secondary | ICD-10-CM

## 2020-12-22 DIAGNOSIS — M5382 Other specified dorsopathies, cervical region: Secondary | ICD-10-CM

## 2020-12-22 DIAGNOSIS — G4486 Cervicogenic headache: Secondary | ICD-10-CM

## 2020-12-22 NOTE — Telephone Encounter (Signed)
Telephone call from [facility].  Per rep pt want to change the location of her MRI to [facility].  New order added.  Order faxed to [phone]

## 2020-12-25 ENCOUNTER — Telehealth: Payer: Self-pay | Admitting: Neurology

## 2020-12-25 NOTE — Telephone Encounter (Signed)
Referral refaxed to [facility].

## 2020-12-26 ENCOUNTER — Telehealth: Payer: Self-pay | Admitting: Neurology

## 2020-12-26 DIAGNOSIS — Z3200 Encounter for pregnancy test, result unknown: Secondary | ICD-10-CM | POA: Diagnosis not present

## 2020-12-26 DIAGNOSIS — E063 Autoimmune thyroiditis: Secondary | ICD-10-CM | POA: Diagnosis not present

## 2020-12-26 DIAGNOSIS — Z3689 Encounter for other specified antenatal screening: Secondary | ICD-10-CM | POA: Diagnosis not present

## 2020-12-26 LAB — OB RESULTS CONSOLE ANTIBODY SCREEN: Antibody Screen: NEGATIVE

## 2020-12-26 LAB — OB RESULTS CONSOLE ABO/RH: RH Type: POSITIVE

## 2020-12-26 NOTE — Progress Notes (Signed)
Order ID [id number]  Approved 12/20/20 until 01/18/21

## 2020-12-27 NOTE — Telephone Encounter (Signed)
Spoke to Arlene this morning, Referral faxed Via Epic as well as via fax machine.

## 2020-12-28 DIAGNOSIS — Z3490 Encounter for supervision of normal pregnancy, unspecified, unspecified trimester: Secondary | ICD-10-CM | POA: Diagnosis not present

## 2021-01-04 DIAGNOSIS — Z3201 Encounter for pregnancy test, result positive: Secondary | ICD-10-CM | POA: Diagnosis not present

## 2021-01-19 DIAGNOSIS — Z87448 Personal history of other diseases of urinary system: Secondary | ICD-10-CM | POA: Diagnosis not present

## 2021-01-19 DIAGNOSIS — Z3201 Encounter for pregnancy test, result positive: Secondary | ICD-10-CM | POA: Diagnosis not present

## 2021-01-19 DIAGNOSIS — O200 Threatened abortion: Secondary | ICD-10-CM | POA: Diagnosis not present

## 2021-01-23 ENCOUNTER — Ambulatory Visit: Payer: BC Managed Care – PPO | Attending: Internal Medicine

## 2021-01-23 DIAGNOSIS — Z20822 Contact with and (suspected) exposure to covid-19: Secondary | ICD-10-CM

## 2021-01-24 LAB — SARS-COV-2, NAA 2 DAY TAT

## 2021-01-24 LAB — NOVEL CORONAVIRUS, NAA: SARS-CoV-2, NAA: NOT DETECTED

## 2021-01-25 DIAGNOSIS — Z3480 Encounter for supervision of other normal pregnancy, unspecified trimester: Secondary | ICD-10-CM | POA: Insufficient documentation

## 2021-01-25 DIAGNOSIS — Z3490 Encounter for supervision of normal pregnancy, unspecified, unspecified trimester: Secondary | ICD-10-CM | POA: Insufficient documentation

## 2021-02-07 DIAGNOSIS — E039 Hypothyroidism, unspecified: Secondary | ICD-10-CM | POA: Diagnosis not present

## 2021-02-07 DIAGNOSIS — Z369 Encounter for antenatal screening, unspecified: Secondary | ICD-10-CM | POA: Diagnosis not present

## 2021-02-07 DIAGNOSIS — E232 Diabetes insipidus: Secondary | ICD-10-CM | POA: Diagnosis not present

## 2021-02-07 DIAGNOSIS — Z3481 Encounter for supervision of other normal pregnancy, first trimester: Secondary | ICD-10-CM | POA: Diagnosis not present

## 2021-02-07 DIAGNOSIS — Z3682 Encounter for antenatal screening for nuchal translucency: Secondary | ICD-10-CM | POA: Diagnosis not present

## 2021-02-07 DIAGNOSIS — Z3689 Encounter for other specified antenatal screening: Secondary | ICD-10-CM | POA: Diagnosis not present

## 2021-02-07 DIAGNOSIS — Z0000 Encounter for general adult medical examination without abnormal findings: Secondary | ICD-10-CM | POA: Diagnosis not present

## 2021-02-12 LAB — OB RESULTS CONSOLE GC/CHLAMYDIA
Chlamydia: NEGATIVE
Gonorrhea: NEGATIVE

## 2021-02-12 LAB — OB RESULTS CONSOLE RUBELLA ANTIBODY, IGM: Rubella: IMMUNE

## 2021-02-12 LAB — OB RESULTS CONSOLE HEPATITIS B SURFACE ANTIGEN: Hepatitis B Surface Ag: NEGATIVE

## 2021-02-12 LAB — OB RESULTS CONSOLE HIV ANTIBODY (ROUTINE TESTING): HIV: NONREACTIVE

## 2021-02-12 LAB — OB RESULTS CONSOLE RPR: RPR: NONREACTIVE

## 2021-02-22 ENCOUNTER — Other Ambulatory Visit: Payer: Self-pay

## 2021-02-22 DIAGNOSIS — R339 Retention of urine, unspecified: Secondary | ICD-10-CM | POA: Diagnosis not present

## 2021-02-22 DIAGNOSIS — Z3682 Encounter for antenatal screening for nuchal translucency: Secondary | ICD-10-CM | POA: Diagnosis not present

## 2021-02-22 MED ORDER — DEXLANSOPRAZOLE 60 MG PO CPDR: 60.0000 mg | DELAYED_RELEASE_CAPSULE | Freq: Every day | ORAL | 3 refills | Status: DC

## 2021-02-22 NOTE — Telephone Encounter (Signed)
Viola requested refill on patient's Dexilant. Faxed rx to [phone]. She is up to date on her visits.

## 2021-03-07 ENCOUNTER — Other Ambulatory Visit: Payer: Self-pay

## 2021-03-07 ENCOUNTER — Encounter (HOSPITAL_COMMUNITY): Payer: Self-pay | Admitting: Obstetrics and Gynecology

## 2021-03-07 ENCOUNTER — Inpatient Hospital Stay (HOSPITAL_COMMUNITY)
Admission: AD | Admit: 2021-03-07 | Discharge: 2021-03-07 | Disposition: A | Discharge status: Home / Self Care | Payer: BC Managed Care – PPO | Attending: Obstetrics and Gynecology | Admitting: Obstetrics and Gynecology

## 2021-03-07 DIAGNOSIS — Z3A14 14 weeks gestation of pregnancy: Secondary | ICD-10-CM | POA: Diagnosis not present

## 2021-03-07 DIAGNOSIS — O99891 Other specified diseases and conditions complicating pregnancy: Secondary | ICD-10-CM | POA: Diagnosis not present

## 2021-03-07 DIAGNOSIS — Z79899 Other long term (current) drug therapy: Secondary | ICD-10-CM | POA: Insufficient documentation

## 2021-03-07 DIAGNOSIS — R55 Syncope and collapse: Secondary | ICD-10-CM | POA: Diagnosis not present

## 2021-03-07 DIAGNOSIS — O26892 Other specified pregnancy related conditions, second trimester: Secondary | ICD-10-CM | POA: Insufficient documentation

## 2021-03-07 LAB — URINALYSIS, ROUTINE W REFLEX MICROSCOPIC
Bilirubin Urine: NEGATIVE
Glucose, UA: NEGATIVE mg/dL
Hgb urine dipstick: NEGATIVE
Ketones, ur: NEGATIVE mg/dL
Leukocytes,Ua: NEGATIVE
Nitrite: NEGATIVE
Protein, ur: NEGATIVE mg/dL
Specific Gravity, Urine: 1.013 (ref 1.005–1.030)
pH: 7 (ref 5.0–8.0)

## 2021-03-07 LAB — COMPREHENSIVE METABOLIC PANEL
ALT: 17 U/L (ref 0–44)
AST: 17 U/L (ref 15–41)
Albumin: 3.3 g/dL — ABNORMAL LOW (ref 3.5–5.0)
Alkaline Phosphatase: 38 U/L (ref 38–126)
Anion gap: 8 (ref 5–15)
BUN: 7 mg/dL (ref 6–20)
CO2: 23 mmol/L (ref 22–32)
Calcium: 8.9 mg/dL (ref 8.9–10.3)
Chloride: 105 mmol/L (ref 98–111)
Creatinine, Ser: 0.56 mg/dL (ref 0.44–1.00)
GFR, Estimated: >60 mL/min (ref >60)
Glucose, Bld: 88 mg/dL (ref 70–99)
Potassium: 3.6 mmol/L (ref 3.5–5.1)
Sodium: 136 mmol/L (ref 135–145)
Total Bilirubin: 0.5 mg/dL (ref 0.3–1.2)
Total Protein: 5.9 g/dL — ABNORMAL LOW (ref 6.5–8.1)

## 2021-03-07 LAB — HEMOGLOBIN AND HEMATOCRIT, BLOOD
HCT: 36.2 % (ref 36.0–46.0)
Hemoglobin: 12.4 g/dL (ref 12.0–15.0)

## 2021-03-07 NOTE — MAU Provider Note (Addendum)
History     CSN: [id number]  Arrival date and time: 03/07/21 1139   Event Date/Time   First Provider Initiated Contact with Patient 03/07/21 1323      Chief Complaint  Patient presents with   Syncope   Fall   HPI  Ms.Kristine Taylor is a 30 y.o. female G2P1001 @ 57w6dhere with fall and syncope. At 0915 this morning she was sitting on the floor playing with her son. She started feeling bad. She stood up and felt black around her vision and fell. She doesn't think she was down long.  She woke up on her right side. She did not hit her head. She denies HA. This has not happened to her in the past. This is a new problem. She does not feel dizzy now.  Reports drinking a significant amount of water daily.   Diet recall:  She woke up at 0630 am: She ate rice chex with whole milk and strawberries, and water. She went on a 2 mile walk this morning after she ate breakfast.  When she got back from her walk and drank a body armour because she was feeling off.   OB History     Gravida  2   Para  1   Term  1   Preterm      AB      Living  1      SAB      IAB      Ectopic      Multiple  0   Live Births  1           Past Medical History:  Diagnosis Date   Arthritis    Asthma    Celiac disease?    Permissive genes not present on [facility]   Chronic kidney disease    Esophageal dysmotility    GERD (gastroesophageal reflux disease)    Hashimoto's disease    History of stomach ulcers    Hypothyroidism    [place]    Shingles    Vaginal Pap smear, abnormal     Past Surgical History:  Procedure Laterality Date   ESOPHAGEAL MANOMETRY  2017   ESOPHAGOGASTRODUODENOSCOPY  12/2015   LEEP  2018   NASAL SEPTUM SURGERY     NO[facility]PAIR  2006   [facility]    Family History  Problem Relation Age of Onset   Graves' disease Mother    Colon polyps Mother    Hyperlipidemia Father    Hypertension Father    Healthy Sister     Graves' disease Maternal Grandfather     Social History   Tobacco Use   Smoking status: Never   Smokeless tobacco: Never  Vaping Use   Vaping Use: Never used  Substance Use Topics   Alcohol use: Yes    Comment: OCCASIONAL   Drug use: Never    Allergies:  Allergies  Allergen Reactions   Gluten Meal    Imitrex [Sumatriptan]     Jaw and shoulder pain     Medications Prior to Admission  Medication Sig Dispense Refill Last Dose   dexlansoprazole (DEXILANT) 60 MG capsule Take 1 capsule (60 mg total) by mouth daily. 90 capsule 3 03/07/2021   famotidine (PEPCID) 20 MG tablet TAKE 1 TABLET BY MOUTH TWICE A DAY (Patient taking differently: Take 20 mg by mouth daily.) 180 tablet 1 Past Week   levothyroxine (SYNTHROID) 112 MCG tablet Take 112 mcg by  mouth daily before breakfast.   03/07/2021   nitrofurantoin, macrocrystal-monohydrate, (MACROBID) 100 MG capsule Take 100 mg by mouth 2 (two) times daily.   03/06/2021   Prenatal Vit-Fe Fumarate-FA (MULTIVITAMIN-PRENATAL) 27-0.8 MG TABS tablet Take 1 tablet by mouth daily at 12 noon.   03/06/2021   eletriptan (RELPAX) 40 MG tablet Take 1 tablet earliest onset of headache.  May repeat in 2 hours if headache persists or recurs.  Maximum 2 tablets in 24 hours. (Patient taking differently: Take 1 tablet earliest onset of headache.  May repeat in 2 hours if headache persists or recurs.  Maximum 2 tablets in 24 hours. As needed) 10 tablet 5    Fe Cbn-Fe Gluc-FA-B12-C-DSS (FERRALET 90) 90-1 MG TABS Take 1 tablet by mouth daily.  (Patient not taking: Reported on 12/20/2020)      metoCLOPramide (REGLAN) 10 MG tablet Take 1 tab by mouth once daily as needed for migraine and reflux 30 tablet 0 More than a month   Probiotic Product (PROBIOTIC DAILY PO) Take 1 tablet by mouth daily.       Results for orders placed or performed during the hospital encounter of 03/07/21 (from the past 48 hour(s))  Urinalysis, Routine w reflex microscopic Urine, Clean Catch      Status: None   Collection Time: 03/07/21 11:59 AM  Result Value Ref Range   Color, Urine YELLOW YELLOW   APPearance CLEAR CLEAR   Specific Gravity, Urine 1.013 1.005 - 1.030   pH 7.0 5.0 - 8.0   Glucose, UA NEGATIVE NEGATIVE mg/dL   Hgb urine dipstick NEGATIVE NEGATIVE   Bilirubin Urine NEGATIVE NEGATIVE   Ketones, ur NEGATIVE NEGATIVE mg/dL   Protein, ur NEGATIVE NEGATIVE mg/dL   Nitrite NEGATIVE NEGATIVE   Leukocytes,Ua NEGATIVE NEGATIVE    Comment: Performed at [facility] [address], [place], [place] [address]  Hemoglobin and hematocrit, blood     Status: None   Collection Time: 03/07/21 12:38 PM  Result Value Ref Range   Hemoglobin 12.4 12.0 - 15.0 g/dL   HCT 36.2 36.0 - 46.0 %    Comment: Performed at [facility], [place] [address] [place], [place] [address]  Comprehensive metabolic panel     Status: Abnormal   Collection Time: 03/07/21 12:38 PM  Result Value Ref Range   Sodium 136 135 - 145 mmol/L   Potassium 3.6 3.5 - 5.1 mmol/L   Chloride 105 98 - 111 mmol/L   CO2 23 22 - 32 mmol/L   Glucose, Bld 88 70 - 99 mg/dL    Comment: Glucose reference range applies only to samples taken after fasting for at least 8 hours.   BUN 7 6 - 20 mg/dL   Creatinine, Ser 0.56 0.44 - 1.00 mg/dL   Calcium 8.9 8.9 - 10.3 mg/dL   Total Protein 5.9 (L) 6.5 - 8.1 g/dL   Albumin 3.3 (L) 3.5 - 5.0 g/dL   AST 17 15 - 41 U/L   ALT 17 0 - 44 U/L   Alkaline Phosphatase 38 38 - 126 U/L   Total Bilirubin 0.5 0.3 - 1.2 mg/dL   GFR, Estimated >60 >60 mL/min    Comment: (NOTE) Calculated using the CKD-EPI Creatinine Equation (2021)    Anion gap 8 5 - 15    Comment: Performed at [facility], [place] [address] [place], [place] [address]    Review of Systems  Constitutional:  Negative for fatigue and fever.  Respiratory:  Negative for  chest tightness and shortness of breath.   Gastrointestinal:  Negative for abdominal pain.  Genitourinary:  Negative for vaginal bleeding.   Neurological:  Negative for dizziness and headaches.  Physical Exam   Blood pressure 105/62, pulse 87, temperature 98 F (36.7 C), temperature source Oral, resp. rate 19, height 5' 5"  (1.651 m), weight 55 kg, SpO2 97 %, currently breastfeeding.  Physical Exam Vitals and nursing note reviewed.  Constitutional:      General: She is not in acute distress.    Appearance: Normal appearance. She is not ill-appearing, toxic-appearing or diaphoretic.  Cardiovascular:     Rate and Rhythm: Normal rate.     Pulses: Normal pulses.  Pulmonary:     Effort: Pulmonary effort is normal.  Musculoskeletal:        General: Normal range of motion.     Cervical back: Neck supple.  Skin:    General: Skin is warm.  Neurological:     Mental Status: She is alert and oriented to person, place, and time.  Psychiatric:        Behavior: Behavior normal.    MAU Course  Procedures  MDM  Orthostatic vital signs normal CBC and CMP without acute changes EKG normal : Dr. Janeth Rase reviewed EKG and reports no acute findings.  UA without signs of dehydration Reviewed diet and recommendations. Add more protein into AM routine prior to exercise. Avoid sugary drinks and cereals unless protein added too.   Assessment and Plan   A:  1. Syncope, unspecified syncope type   2. [redacted] weeks gestation of pregnancy      P:  Discharge home in stable condition Return to MAU if symptoms worsen If symptoms continue would recommend Cardiology referral.  Frequent snacks.  Noni Saupe I, NP 03/09/2021. 11:29 AM

## 2021-03-07 NOTE — MAU Note (Signed)
Presents with c/o syncopal episode @ 0915 this morning.  Reports landed onto right side, didn't strike abdomen.  Endorses lower right sided cramping.  Denies VB.

## 2021-03-21 DIAGNOSIS — O26892 Other specified pregnancy related conditions, second trimester: Secondary | ICD-10-CM | POA: Diagnosis not present

## 2021-03-21 DIAGNOSIS — Z9889 Other specified postprocedural states: Secondary | ICD-10-CM | POA: Diagnosis not present

## 2021-03-21 DIAGNOSIS — Z361 Encounter for antenatal screening for raised alphafetoprotein level: Secondary | ICD-10-CM | POA: Diagnosis not present

## 2021-04-03 DIAGNOSIS — Z363 Encounter for antenatal screening for malformations: Secondary | ICD-10-CM | POA: Diagnosis not present

## 2021-04-24 DIAGNOSIS — N898 Other specified noninflammatory disorders of vagina: Secondary | ICD-10-CM | POA: Diagnosis not present

## 2021-04-24 DIAGNOSIS — N760 Acute vaginitis: Secondary | ICD-10-CM | POA: Diagnosis not present

## 2021-04-24 DIAGNOSIS — Z3482 Encounter for supervision of other normal pregnancy, second trimester: Secondary | ICD-10-CM | POA: Diagnosis not present

## 2021-04-24 DIAGNOSIS — R82998 Other abnormal findings in urine: Secondary | ICD-10-CM | POA: Diagnosis not present

## 2021-04-24 DIAGNOSIS — R3989 Other symptoms and signs involving the genitourinary system: Secondary | ICD-10-CM | POA: Diagnosis not present

## 2021-04-24 DIAGNOSIS — B373 Candidiasis of vulva and vagina: Secondary | ICD-10-CM | POA: Diagnosis not present

## 2021-04-25 DIAGNOSIS — R300 Dysuria: Secondary | ICD-10-CM | POA: Insufficient documentation

## 2021-05-02 DIAGNOSIS — Z23 Encounter for immunization: Secondary | ICD-10-CM | POA: Diagnosis not present

## 2021-05-02 DIAGNOSIS — Z3A22 22 weeks gestation of pregnancy: Secondary | ICD-10-CM | POA: Diagnosis not present

## 2021-05-02 DIAGNOSIS — O99282 Endocrine, nutritional and metabolic diseases complicating pregnancy, second trimester: Secondary | ICD-10-CM | POA: Diagnosis not present

## 2021-05-02 IMAGING — CT CT HEAD W/O CM
1 series · 16 of 30 positions shown, 20 images · non-contrast
Comparison: July 09, 2011

CLINICAL DATA: Headache with blurred vision.

EXAM:
CT HEAD WITHOUT CONTRAST
TECHNIQUE: Contiguous axial images were obtained from the base of the skull
through the vertex without intravenous contrast.

[Series 2: head w/(date) · axial · 0.40mm/px · z∈[-128,+7]mm · 16 of 31 slices shown, 20 images]
[im 2/31  brain]
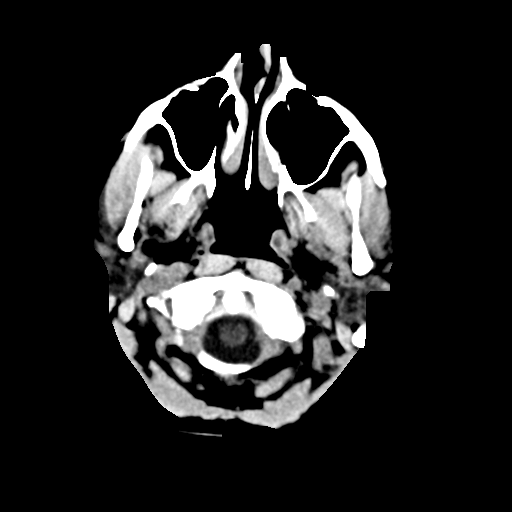
[im 2/31  bone]
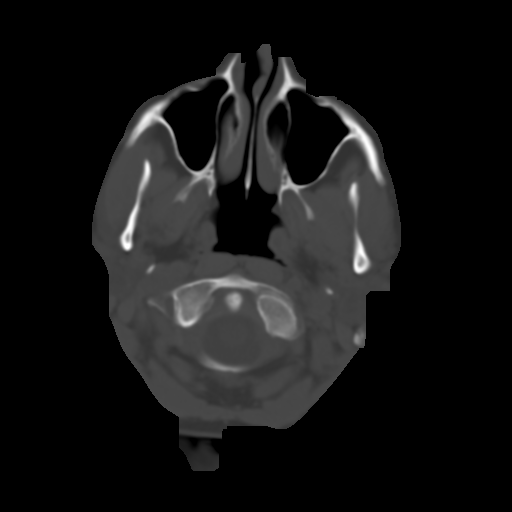
[im 4/31  brain]
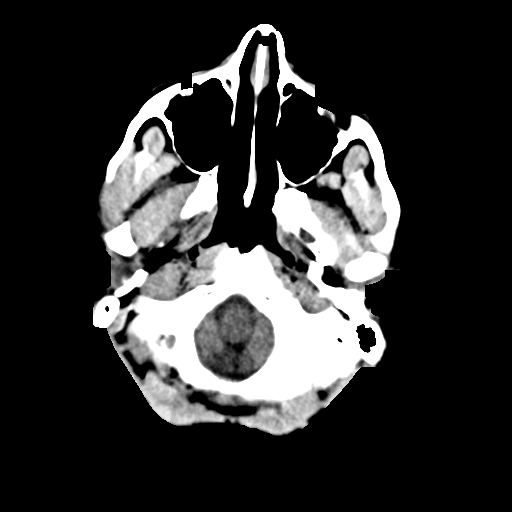
[im 6/31  brain]
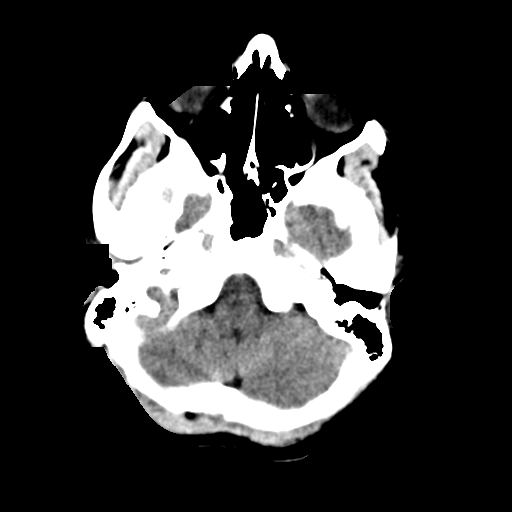
[im 8/31  brain]
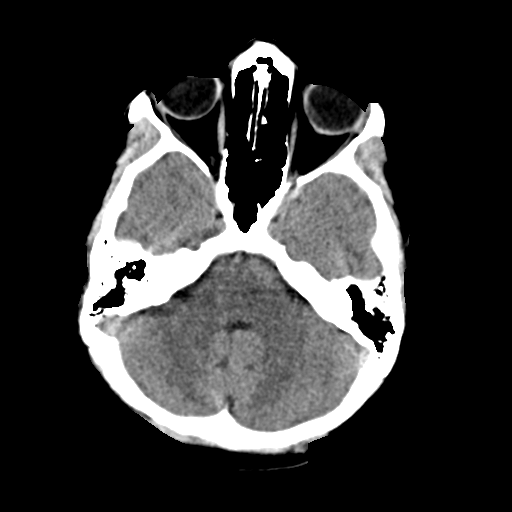
[im 9/31  brain]
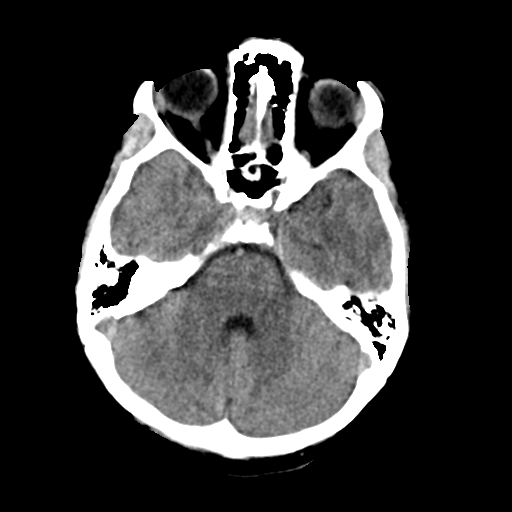
[im 9/31  bone]
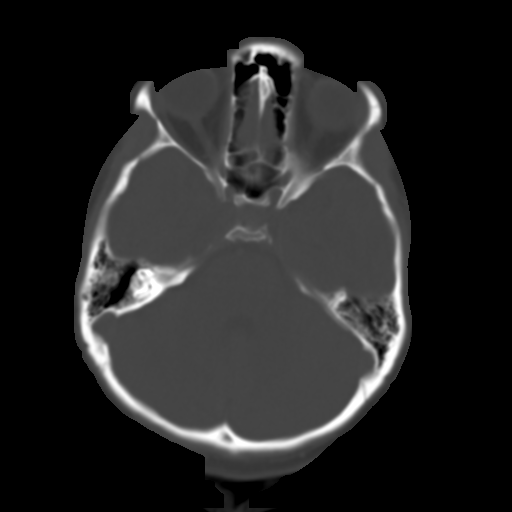
[im 11/31  brain]
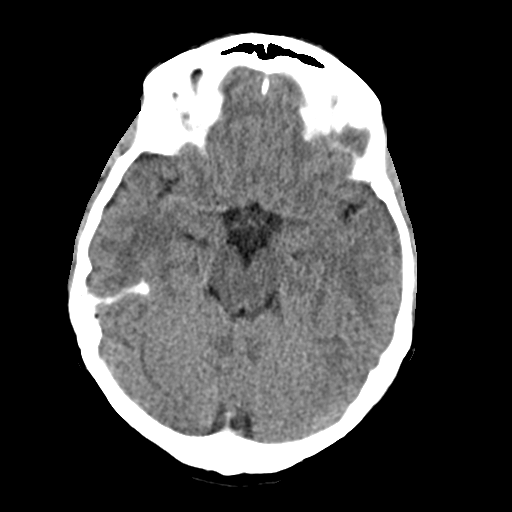
[im 13/31  brain]
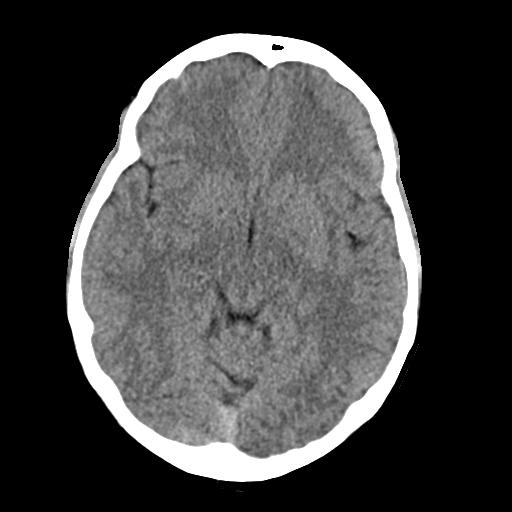
[im 15/31  brain]
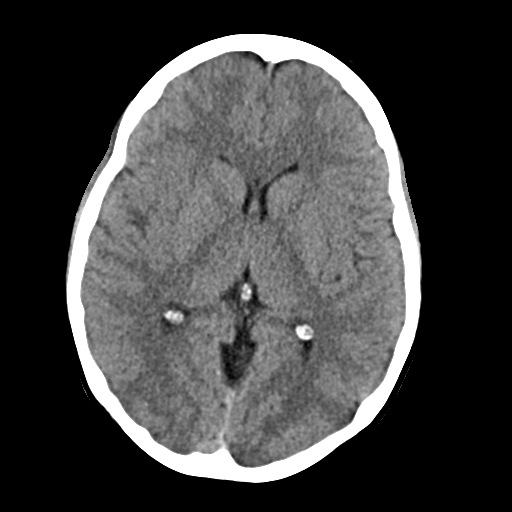
[im 16/31  brain]
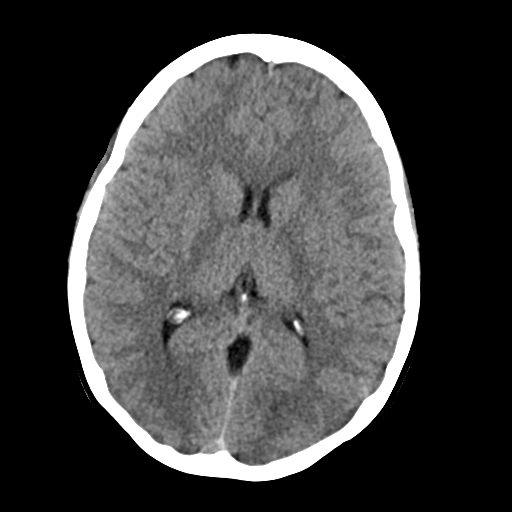
[im 16/31  bone]
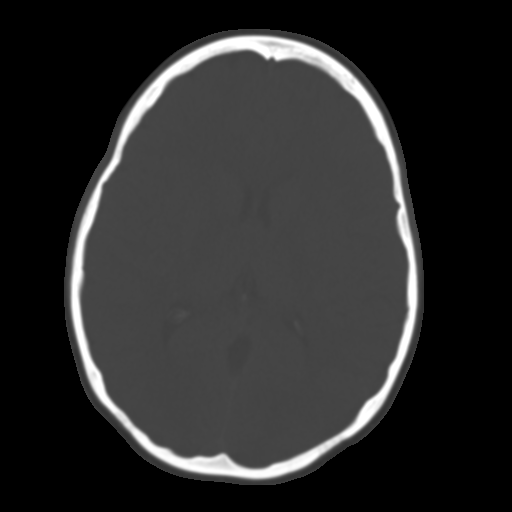
[im 18/31  brain]
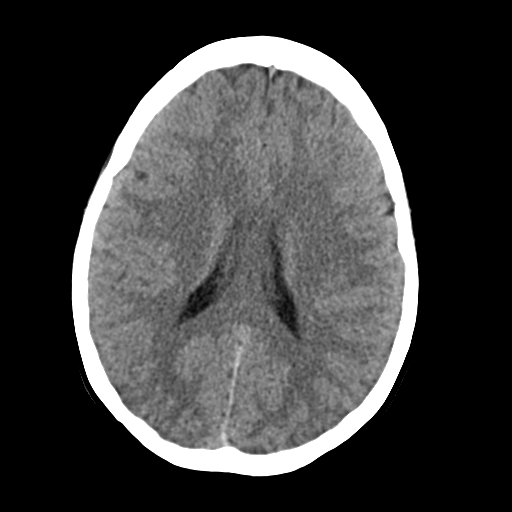
[im 20/31  brain]
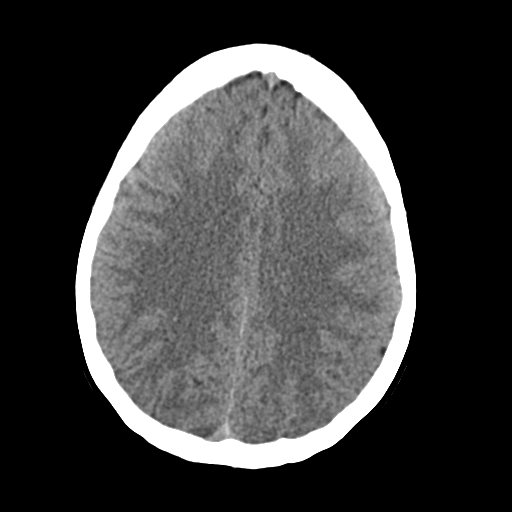
[im 22/31  brain]
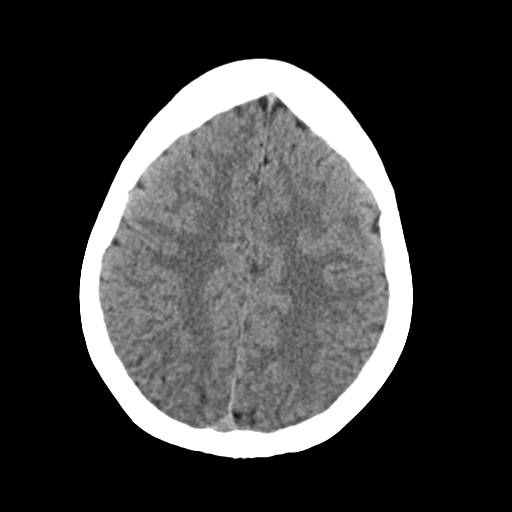
[im 23/31  brain]
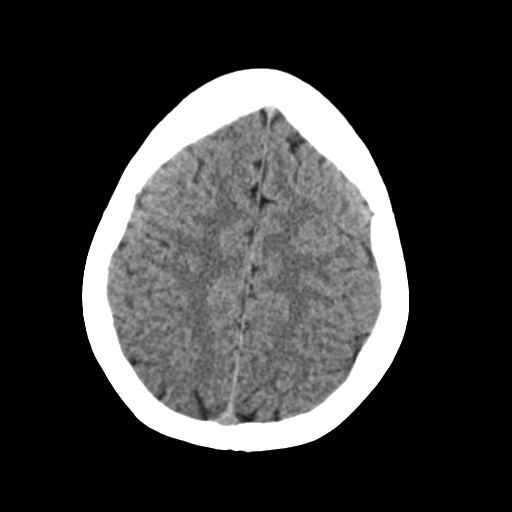
[im 23/31  bone]
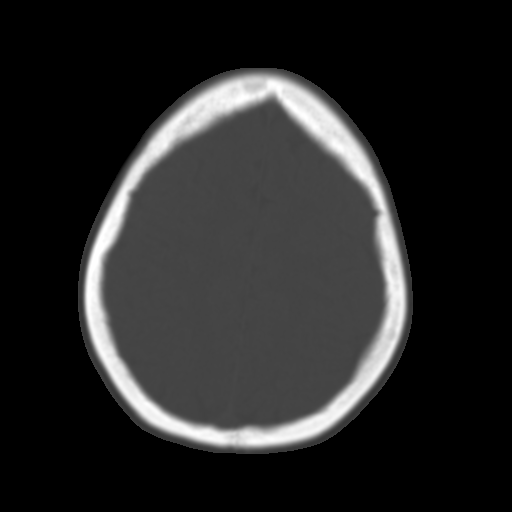
[im 25/31  brain]
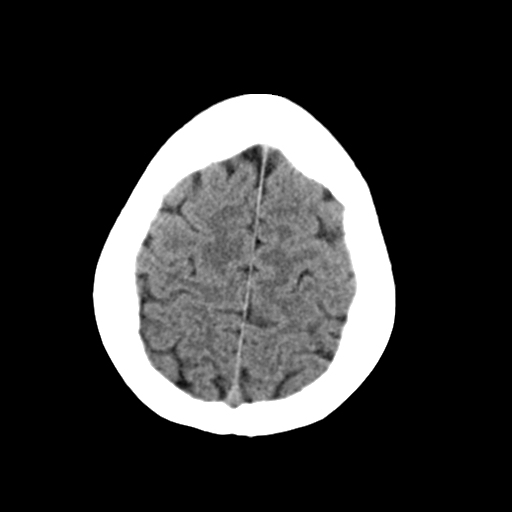
[im 27/31  brain]
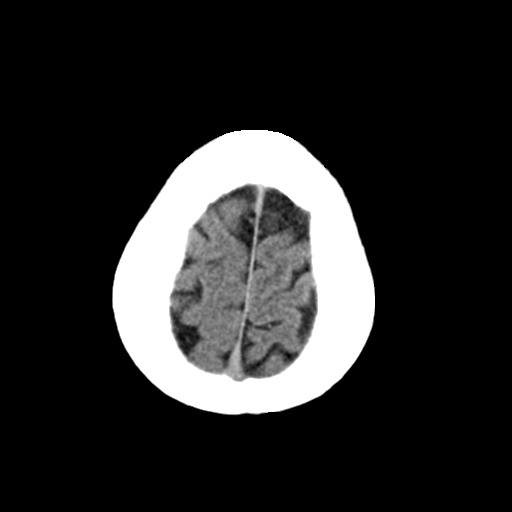
[im 29/31  brain]
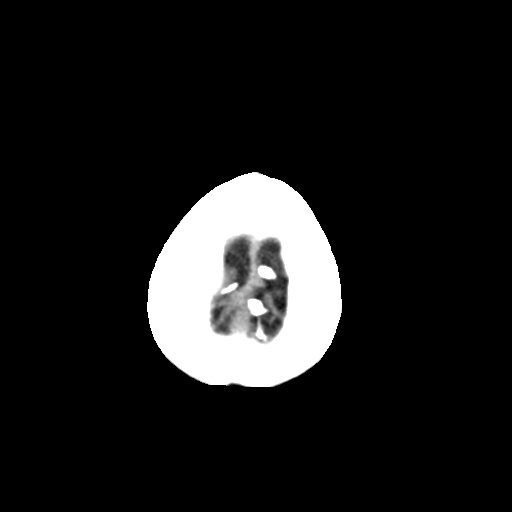

[16 of 30 positions shown; findings below may reference images not displayed]

FINDINGS: Brain: No evidence of acute infarction, hemorrhage, hydrocephalus,
extra-axial collection or mass lesion/mass effect.

Vascular: No hyperdense vessel or unexpected calcification.

Skull: Normal. Negative for fracture or focal lesion.

Sinuses/Orbits: No acute finding.

Other: None.
IMPRESSION: No acute intracranial pathology.

## 2021-05-10 DIAGNOSIS — E875 Hyperkalemia: Secondary | ICD-10-CM | POA: Diagnosis not present

## 2021-05-11 DIAGNOSIS — O26892 Other specified pregnancy related conditions, second trimester: Secondary | ICD-10-CM | POA: Diagnosis not present

## 2021-05-11 DIAGNOSIS — N898 Other specified noninflammatory disorders of vagina: Secondary | ICD-10-CM | POA: Diagnosis not present

## 2021-05-11 DIAGNOSIS — Z3A23 23 weeks gestation of pregnancy: Secondary | ICD-10-CM | POA: Diagnosis not present

## 2021-05-14 DIAGNOSIS — R631 Polydipsia: Secondary | ICD-10-CM | POA: Diagnosis not present

## 2021-05-14 DIAGNOSIS — R3589 Other polyuria: Secondary | ICD-10-CM | POA: Diagnosis not present

## 2021-05-14 DIAGNOSIS — Z3492 Encounter for supervision of normal pregnancy, unspecified, second trimester: Secondary | ICD-10-CM | POA: Insufficient documentation

## 2021-05-14 DIAGNOSIS — E039 Hypothyroidism, unspecified: Secondary | ICD-10-CM | POA: Diagnosis not present

## 2021-05-14 DIAGNOSIS — Z6821 Body mass index (BMI) 21.0-21.9, adult: Secondary | ICD-10-CM | POA: Diagnosis not present

## 2021-06-05 DIAGNOSIS — Z3A27 27 weeks gestation of pregnancy: Secondary | ICD-10-CM | POA: Diagnosis not present

## 2021-06-05 DIAGNOSIS — Z3689 Encounter for other specified antenatal screening: Secondary | ICD-10-CM | POA: Diagnosis not present

## 2021-06-05 DIAGNOSIS — O99282 Endocrine, nutritional and metabolic diseases complicating pregnancy, second trimester: Secondary | ICD-10-CM | POA: Diagnosis not present

## 2021-06-05 DIAGNOSIS — Z23 Encounter for immunization: Secondary | ICD-10-CM | POA: Diagnosis not present

## 2021-06-05 DIAGNOSIS — R609 Edema, unspecified: Secondary | ICD-10-CM | POA: Diagnosis not present

## 2021-06-05 DIAGNOSIS — E039 Hypothyroidism, unspecified: Secondary | ICD-10-CM | POA: Diagnosis not present
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
## 2021-06-25 ENCOUNTER — Encounter: Payer: Self-pay | Admitting: Neurology

## 2021-06-25 ENCOUNTER — Ambulatory Visit: Payer: BC Managed Care – PPO | Admitting: Neurology

## 2021-06-25 ENCOUNTER — Other Ambulatory Visit: Payer: Self-pay

## 2021-06-25 VITALS — BP 90/60 | HR 97 | Ht 65.0 in | Wt 131.8 lb

## 2021-06-25 DIAGNOSIS — G4486 Cervicogenic headache: Secondary | ICD-10-CM | POA: Diagnosis not present

## 2021-06-25 DIAGNOSIS — G43109 Migraine with aura, not intractable, without status migrainosus: Secondary | ICD-10-CM | POA: Diagnosis not present

## 2021-06-25 DIAGNOSIS — Z3A30 30 weeks gestation of pregnancy: Secondary | ICD-10-CM | POA: Diagnosis not present

## 2021-06-25 NOTE — Patient Instructions (Signed)
Consider magnesium citrate 489m daily, riboflavin (B2) 4096mdaily, and coenzyme Q-10 10082mhree times daily May take Fioricet as needed but limit to no more than 1 day out of the week  Keep headache diary Follow up 6 months - contact me sooner if we need to make a change in treatment

## 2021-06-28 DIAGNOSIS — Z3A30 30 weeks gestation of pregnancy: Secondary | ICD-10-CM | POA: Diagnosis not present

## 2021-06-28 DIAGNOSIS — R55 Syncope and collapse: Secondary | ICD-10-CM | POA: Diagnosis not present

## 2021-06-28 DIAGNOSIS — R42 Dizziness and giddiness: Secondary | ICD-10-CM | POA: Diagnosis not present

## 2021-06-28 DIAGNOSIS — Z3689 Encounter for other specified antenatal screening: Secondary | ICD-10-CM | POA: Diagnosis not present

## 2021-07-06 ENCOUNTER — Other Ambulatory Visit: Payer: Self-pay

## 2021-07-06 ENCOUNTER — Encounter: Payer: Self-pay | Admitting: Cardiology

## 2021-07-06 ENCOUNTER — Ambulatory Visit: Payer: BC Managed Care – PPO | Admitting: Cardiology

## 2021-07-06 VITALS — BP 100/58 | HR 92 | Resp 16 | Ht 65.0 in | Wt 134.2 lb

## 2021-07-06 DIAGNOSIS — R55 Syncope and collapse: Secondary | ICD-10-CM

## 2021-07-06 NOTE — Progress Notes (Addendum)
Date:  07/06/2021   ID:  Ginger Organ, DOB 1991-03-01, MRN [id number]  PCP:  Billie Ruddy, MD  Cardiologist:  Rex Kras, DO, [facility] (established care 07/06/2021)  REASON FOR CONSULT: Syncope  REQUESTING PHYSICIAN:  Aloha Gell, MD  [place], [place] [address]  Chief Complaint  Patient presents with   New Patient (Initial Visit)   Loss of Consciousness    HPI  Kristine Taylor is a 30 y.o. Caucasian female who presents to the office with a chief complaint of " loss of consciousness." Patient's past medical history: Esophageal motility disorder, Hashimoto's, baseline low blood pressure, history of diabetes insipidus.  She is referred to the office at the request of Billie Ruddy, MD for evaluation of syncope.  Patient is currently in her second pregnancy at 31 weeks and 6 days of gestation presents to the office for evaluation of syncope.  Patient is due to have a baby girl in January 2023.  Her first pregnancy was overall unremarkable except the fact that she was diagnosed with diabetes insipidus.  Patient states that at baseline she does have low blood pressure and feels asymptomatic.  However during her current pregnancy she has had 2 episodes of syncope.   The first episode of syncope happened at 14 weeks 6 days while she was sitting on the floor playing with her son.  She got up and started feeling "bad" and experienced tunnel vision and shortly thereafter fell and lost consciousness.  This was only witnessed by her 77-year-old son.  She shortly woke up and was able to recall the events and had not sustained any injury.  No loss of bowel or bladder function.  She went to the maternity unit and was observed for approximately 5 hours.  No identifiable reversible cause.  It was attributed to poor oral intake.  The second episode happened last week and prior to the event she was experiencing lightheaded, dizziness, of breath, and noted that her heart was  beating faster for few days prior.  Earlier today patient checked her blood pressures and her SBP was around 88 mmHg.  She did not feel well that day and while she was in her bedroom upstairs she fell and had a brief episode of loss of consciousness which was unwitnessed.  Her husband quickly attended her and noted that she probably regained consciousness within the first 30 seconds.  Now referred to cardiology for further evaluation and management.  Patient states that prior to her pregnancies she is always had low blood pressures at baseline.  Patient states that she is eating and drinking well throughout the day.  She consumes at least 5 L of water.  Patient states that after she is standing for prolonged period time she starts getting a notification on her smart watch that her heart rate is elevated as if she is exercising.  While growing up she has not experienced any syncope with playing exertional sports or being active.  During her pregnancy she has been walking 2 or 3 miles on a daily basis along with either doing Pilates or dance classes 5 days a week.  Her symptoms as noted above are usually more prominent during the morning hours.  FUNCTIONAL STATUS: 2-3 mile walk after day along w/ Piliates or dance class 5 days a week.    ALLERGIES: Allergies  Allergen Reactions   Gluten Meal    Imitrex [Sumatriptan]     Jaw and shoulder pain  MEDICATION LIST PRIOR TO VISIT: Current Meds  Medication Sig   dexlansoprazole (DEXILANT) 60 MG capsule Take 1 capsule (60 mg total) by mouth daily.   famotidine (PEPCID) 20 MG tablet TAKE 1 TABLET BY MOUTH TWICE A DAY (Patient taking differently: as needed.)   levothyroxine (SYNTHROID) 112 MCG tablet Take 112 mcg by mouth daily before breakfast.   nitrofurantoin, macrocrystal-monohydrate, (MACROBID) 100 MG capsule Take 100 mg by mouth daily.   Prenatal Vit-Fe Fumarate-FA (MULTIVITAMIN-PRENATAL) 27-0.8 MG TABS tablet Take 1 tablet by mouth daily at 12  noon.   Probiotic Product (PROBIOTIC DAILY PO) Take 1 tablet by mouth daily.      PAST MEDICAL HISTORY: Past Medical History:  Diagnosis Date   Arthritis    Asthma    Celiac disease?    Permissive genes not present on [facility]   Chronic kidney disease    Esophageal dysmotility    GERD (gastroesophageal reflux disease)    Hashimoto's disease    History of stomach ulcers    Hypothyroidism    Lymphocytic hypophysitis (HCC)    Shingles    Vaginal Pap smear, abnormal     PAST SURGICAL HISTORY: Past Surgical History:  Procedure Laterality Date   ESOPHAGEAL MANOMETRY  2017   ESOPHAGOGASTRODUODENOSCOPY  12/2015   LEEP  2018   NASAL SEPTUM SURGERY     NOSE REPAIR  2006   [facility] IMPEDANCE STUDY  2017    FAMILY HISTORY: The patient family history includes Colon polyps in her mother; Berenice Primas' disease in her maternal grandfather and mother; Healthy in her sister; Hyperlipidemia in her father; Hypertension in her father.  SOCIAL HISTORY:  The patient  reports that she has never smoked. She has never used smokeless tobacco. She reports that she does not currently use alcohol. She reports that she does not use drugs.  REVIEW OF SYSTEMS: Review of Systems  Constitutional: Negative for chills and fever.  HENT:  Negative for hoarse voice and nosebleeds.   Eyes:  Negative for discharge, double vision and pain.  Cardiovascular:  Positive for syncope. Negative for chest pain, claudication, dyspnea on exertion, leg swelling, near-syncope, orthopnea, palpitations and paroxysmal nocturnal dyspnea.  Respiratory:  Negative for hemoptysis and shortness of breath.   Musculoskeletal:  Negative for muscle cramps and myalgias.  Gastrointestinal:  Negative for abdominal pain, constipation, diarrhea, hematemesis, hematochezia, melena, nausea and vomiting.  Neurological:  Negative for dizziness and light-headedness.   PHYSICAL EXAM: Vitals with BMI 07/06/2021 06/25/2021 03/07/2021  Height 5' 5"   5' 5"  -  Weight 134 lbs 3 oz 131 lbs 13 oz -  BMI 76.73 41.93 -  Systolic 790 90 91  Diastolic 58 60 58  Pulse 92 97 78   Orthostatic VS for the past 72 hrs (Last 3 readings):  Orthostatic BP Patient Position BP Location Cuff Size Orthostatic Pulse  07/06/21 1347 92/55 Standing Left Arm Normal 92  07/06/21 1346 (!) 89/57 Sitting Left Arm Normal 95  07/06/21 1345 99/59 Supine Left Arm Normal 86     CONSTITUTIONAL: Well-developed and well-nourished. No acute distress.  SKIN: Skin is warm and dry. No rash noted. No cyanosis. No pallor. No jaundice HEAD: Normocephalic and atraumatic.  EYES: No scleral icterus MOUTH/THROAT: Moist oral membranes.  NECK: No JVD present. No thyromegaly noted. No carotid bruits  LYMPHATIC: No visible cervical adenopathy.  CHEST Normal respiratory effort. No intercostal retractions  LUNGS: Clear to auscultation bilaterally.  No stridor. No wheezes. No rales.  CARDIOVASCULAR: Regular, tachycardic, positive S1-S2,  no murmurs rubs or gallops appreciated. ABDOMINAL: Gravid uterus, positive bowel sounds in all 4 quadrants, no apparent ascites.  EXTREMITIES: No peripheral edema, warm to touch, 2+ DP and PT pulses HEMATOLOGIC: No significant bruising NEUROLOGIC: Oriented to person, place, and time. Nonfocal. Normal muscle tone.  PSYCHIATRIC: Normal mood and affect. Normal behavior. Cooperative  CARDIAC DATABASE: EKG: 06/21/2021: NSR, 91 bpm, without underlying ischemia or injury pattern.   Echocardiogram: No results found for this or any previous visit from the past 1095 days.    Stress Testing: No results found for this or any previous visit from the past 1095 days.   Heart Catheterization: None  LABORATORY DATA: CBC Latest Ref Rng & Units 03/07/2021 09/18/2020 07/24/2019  WBC 4.0 - 10.5 K/uL - 4.9 11.0(H)  Hemoglobin 12.0 - 15.0 g/dL 12.4 14.4 11.8(L)  Hematocrit 36.0 - 46.0 % 36.2 41.5 34.9(L)  Platelets 150.0 - 400.0 K/uL - 261.0 192    CMP Latest  Ref Rng & Units 03/07/2021 09/18/2020 11/13/2018  Glucose 70 - 99 mg/dL 88 85 87  BUN 6 - 20 mg/dL 7 12 12   Creatinine 0.44 - 1.00 mg/dL 0.56 0.62 0.75  Sodium 135 - 145 mmol/L 136 141 138  Potassium 3.5 - 5.1 mmol/L 3.6 4.8 4.8  Chloride 98 - 111 mmol/L 105 106 104  CO2 22 - 32 mmol/L 23 30 28   Calcium 8.9 - 10.3 mg/dL 8.9 9.7 9.7  Total Protein 6.5 - 8.1 g/dL 5.9(L) 6.7 7.4  Total Bilirubin 0.3 - 1.2 mg/dL 0.5 0.5 0.7  Alkaline Phos 38 - 126 U/L 38 63 58  AST 15 - 41 U/L 17 15 16   ALT 0 - 44 U/L 17 13 15     Lipid Panel  No results found for: CHOL, TRIG, HDL, CHOLHDL, VLDL, LDLCALC, LDLDIRECT, LABVLDL  No components found for: NTPROBNP No results for input(s): PROBNP in the last 8760 hours. Recent Labs    09/18/20 1054  TSH 1.61    BMP Recent Labs    09/18/20 0939 03/07/21 1238  NA 141 136  K 4.8 3.6  CL 106 105  CO2 30 23  GLUCOSE 85 88  BUN 12 7  CREATININE 0.62 0.56  CALCIUM 9.7 8.9  GFRNONAA  --  >60    HEMOGLOBIN A1C No results found for: HGBA1C, MPG  IMPRESSION:    ICD-10-CM   1. Syncope and collapse  R55 EKG 12-Lead    PCV ECHOCARDIOGRAM COMPLETE       RECOMMENDATIONS: Kristine Taylor is a 30 y.o. Caucasian female whose past medical history and cardiac risk factors include: Esophageal motility disorder, Hashimoto's, baseline low blood pressure, history of diabetes insipidus.  Syncope: Suspect vasovagal etiology. No identifiable reversible cause. Patient states that she recently was screened for gestational diabetes, anemia, and thyroid disease. She is on iron supplement as well as Synthroid. EKG: Nonischemic Patient has history of diabetes insipidus and does drink approximately 5 L on regular basis. I have asked her to increase the salt content in her diet and also consider supplementing her free water for electrolyte-based water. Avoid energy drinks and excessive caffeine intake. Patient was measured for compression stockings.. Educated on  the importance of taking frequent breaks and avoiding prolonged standing. Recommended that she avoid sleeping on the right lateral position during pregnancy. Orthostatic vital signs negative. Echocardiogram will be ordered to evaluate for structural heart disease and left ventricular systolic function. We discussed considering a Holter monitor.  However the shared decision was to hold off  for now unless his symptoms resurface. Because this has happened twice during her pregnancy we discussed firing restrictions with regards to syncope.  I would like to see her back after her pregnancy or sooner if change in clinical status.  As part of this initial consultation reviewed outside records provided by referring provider, hospital records, recent labs, and these findings have been summarized and noted above for further reference.  Discussed disease management, ordering diagnostic testing, coordination of care and patient education provided as a part of today's encounter.  FINAL MEDICATION LIST END OF ENCOUNTER: No orders of the defined types were placed in this encounter.   There are no discontinued medications.   Current Outpatient Medications:    dexlansoprazole (DEXILANT) 60 MG capsule, Take 1 capsule (60 mg total) by mouth daily., Disp: 90 capsule, Rfl: 3   famotidine (PEPCID) 20 MG tablet, TAKE 1 TABLET BY MOUTH TWICE A DAY (Patient taking differently: as needed.), Disp: 180 tablet, Rfl: 1   levothyroxine (SYNTHROID) 112 MCG tablet, Take 112 mcg by mouth daily before breakfast., Disp: , Rfl:    nitrofurantoin, macrocrystal-monohydrate, (MACROBID) 100 MG capsule, Take 100 mg by mouth daily., Disp: , Rfl:    Prenatal Vit-Fe Fumarate-FA (MULTIVITAMIN-PRENATAL) 27-0.8 MG TABS tablet, Take 1 tablet by mouth daily at 12 noon., Disp: , Rfl:    Probiotic Product (PROBIOTIC DAILY PO), Take 1 tablet by mouth daily. , Disp: , Rfl:    eletriptan (RELPAX) 40 MG tablet, Take 1 tablet earliest onset of  headache.  May repeat in 2 hours if headache persists or recurs.  Maximum 2 tablets in 24 hours. (Patient not taking: Reported on 07/06/2021), Disp: 10 tablet, Rfl: 5   metoCLOPramide (REGLAN) 10 MG tablet, Take 1 tab by mouth once daily as needed for migraine and reflux (Patient not taking: Reported on 07/06/2021), Disp: 30 tablet, Rfl: 0  Orders Placed This Encounter  Procedures   EKG 12-Lead   PCV ECHOCARDIOGRAM COMPLETE    There are no Patient Instructions on file for this visit.   --Continue cardiac medications as reconciled in final medication list. --Return in about 2 months (around 09/19/2021) for Follow up LOC, Review test results. Or sooner if needed. --Continue follow-up with your primary care physician regarding the management of your other chronic comorbid conditions.  Patient's questions and concerns were addressed to her satisfaction. She voices understanding of the instructions provided during this encounter.   This note was created using a voice recognition software as a result there may be grammatical errors inadvertently enclosed that do not reflect the nature of this encounter. Every attempt is made to correct such errors.  Rex Kras, [place], [facility]  Pager: [phone] Office: [phone]

## 2021-07-16 DIAGNOSIS — R55 Syncope and collapse: Secondary | ICD-10-CM | POA: Diagnosis not present

## 2021-07-16 DIAGNOSIS — O99283 Endocrine, nutritional and metabolic diseases complicating pregnancy, third trimester: Secondary | ICD-10-CM | POA: Diagnosis not present

## 2021-07-16 DIAGNOSIS — Z3A33 33 weeks gestation of pregnancy: Secondary | ICD-10-CM | POA: Diagnosis not present

## 2021-07-17 ENCOUNTER — Other Ambulatory Visit: Payer: Self-pay

## 2021-07-17 ENCOUNTER — Ambulatory Visit: Payer: BC Managed Care – PPO

## 2021-07-17 DIAGNOSIS — R55 Syncope and collapse: Secondary | ICD-10-CM

## 2021-07-20 ENCOUNTER — Inpatient Hospital Stay (HOSPITAL_COMMUNITY)
Admission: AD | Admit: 2021-07-20 | Discharge: 2021-07-20 | Disposition: A | Discharge status: Home / Self Care | Payer: BC Managed Care – PPO | Attending: Obstetrics and Gynecology | Admitting: Obstetrics and Gynecology

## 2021-07-20 ENCOUNTER — Other Ambulatory Visit: Payer: Self-pay

## 2021-07-20 ENCOUNTER — Encounter (HOSPITAL_COMMUNITY): Payer: Self-pay | Admitting: Obstetrics and Gynecology

## 2021-07-20 DIAGNOSIS — O4703 False labor before 37 completed weeks of gestation, third trimester: Secondary | ICD-10-CM | POA: Insufficient documentation

## 2021-07-20 DIAGNOSIS — R109 Unspecified abdominal pain: Secondary | ICD-10-CM | POA: Diagnosis not present

## 2021-07-20 DIAGNOSIS — O479 False labor, unspecified: Secondary | ICD-10-CM

## 2021-07-20 DIAGNOSIS — Z3689 Encounter for other specified antenatal screening: Secondary | ICD-10-CM

## 2021-07-20 DIAGNOSIS — Z3A33 33 weeks gestation of pregnancy: Secondary | ICD-10-CM | POA: Diagnosis not present

## 2021-07-20 LAB — URINALYSIS, ROUTINE W REFLEX MICROSCOPIC
Bilirubin Urine: NEGATIVE
Glucose, UA: NEGATIVE mg/dL
Hgb urine dipstick: NEGATIVE
Ketones, ur: NEGATIVE mg/dL
Leukocytes,Ua: NEGATIVE
Nitrite: NEGATIVE
Protein, ur: NEGATIVE mg/dL
Specific Gravity, Urine: 1.003 — ABNORMAL LOW (ref 1.005–1.030)
pH: 7 (ref 5.0–8.0)

## 2021-07-20 MED ORDER — CYCLOBENZAPRINE HCL 5 MG PO TABS
10.0000 mg | ORAL_TABLET | Freq: Once | ORAL | Status: AC
  Administered 2021-07-20: 10 mg via ORAL
  Filled 2021-07-20: qty 2

## 2021-07-20 MED ORDER — LACTATED RINGERS IV BOLUS
1000.0000 mL | Freq: Once | INTRAVENOUS | Status: AC
  Administered 2021-07-20: 1000 mL via INTRAVENOUS

## 2021-07-20 MED ORDER — ACETAMINOPHEN 325 MG PO TABS
650.0000 mg | ORAL_TABLET | Freq: Once | ORAL | Status: AC
  Administered 2021-07-20: 650 mg via ORAL
  Filled 2021-07-20: qty 2

## 2021-07-20 NOTE — MAU Note (Signed)
Around 3:00, was out on a walk pushing a stroller, started having ?[address], were very painful.  Had to call her husband to pick them up.  Went home and laid down, pains subsided.  Called OB was told to come in.  Also while she was having the pains, the baby wasn't moving, though she now is.

## 2021-07-20 NOTE — MAU Provider Note (Signed)
History     CSN: [id number]  Arrival date and time: 07/20/21 1654   Event Date/Time   First Provider Initiated Contact with Patient 07/20/21 1852      Chief Complaint  Patient presents with   Abdominal Pain   HPI Kristine Taylor is a 30 y.o. G2P1001 at 62w6dwho presents to MAU with chief complaint of abdominal pain, new onset today around 1500 hours. Patient was pushing her toddler in a stroller when she felt acute onset painful Braxton Hicks contractions. She states they were so painful she had to rest and call her husband to pick her up. Pain resolved with rest and hydration at home. On arrival to MAU she endorses residual tightness but denies recurrence to the intensity of original episode. She denies vaginal bleeding, leaking of fluid, decreased fetal movement, fever, falls, or recent illness.   OB history: term SVD x 1, same partner. Patient receives care with [facility].  OB History     Gravida  2   Para  1   Term  1   Preterm      AB      Living  1      SAB      IAB      Ectopic      Multiple  0   Live Births  1           Past Medical History:  Diagnosis Date   Arthritis    Asthma    Celiac disease?    Permissive genes not present on [facility] testing   Chronic kidney disease    Esophageal dysmotility    GERD (gastroesophageal reflux disease)    Hashimoto's disease    History of stomach ulcers    Hypothyroidism    [place]    Shingles    Vaginal Pap smear, abnormal     Past Surgical History:  Procedure Laterality Date   ESOPHAGEAL MANOMETRY  2017   ESOPHAGOGASTRODUODENOSCOPY  12/2015   LEEP  2018   NASAL SEPTUM SURGERY     [facility]PA[facility]  2006   [facility]    Family History  Problem Relation Age of Onset   Graves' disease Mother    Colon polyps Mother    Hyperlipidemia Father    Hypertension Father    Healthy Sister    Graves' disease Maternal Grandfather     Social History    Tobacco Use   Smoking status: Never   Smokeless tobacco: Never  Vaping Use   Vaping Use: Never used  Substance Use Topics   Alcohol use: Not Currently    Comment: OCCASIONAL   Drug use: Never    Allergies:  Allergies  Allergen Reactions   Gluten Meal    Imitrex [Sumatriptan]     Jaw and shoulder pain     No medications prior to admission.    Review of Systems  Gastrointestinal:  Positive for abdominal pain.  All other systems reviewed and are negative. Physical Exam   Blood pressure (!) 93/53, pulse 89, temperature 98 F (36.7 C), resp. rate 16, height 5' 5"  (1.651 m), weight 61 kg, SpO2 100 %, currently breastfeeding.  Physical Exam Vitals and nursing note reviewed. Exam conducted with a chaperone present.  Constitutional:      Appearance: She is well-developed. She is not ill-appearing.  Cardiovascular:     Rate and Rhythm: Normal rate and regular rhythm.     Heart sounds: Normal heart  sounds.  Pulmonary:     Effort: Pulmonary effort is normal.     Breath sounds: Normal breath sounds.  Abdominal:     Palpations: Abdomen is soft.     Comments: Gravid  Genitourinary:    Comments: Pelvic exam: External genitalia normal, vaginal walls pink and well rugated, cervix visually closed, no lesions noted. FFN collected, cervix closed on digital exam, FFN discarded   Neurological:     Mental Status: She is alert.    MAU Course/MDM  Procedures  --Reactive tracing: baseline 145, mod var, + accels, no decels --Toco: rare contraction, UI --Pertinent negatives: Cat II tracing, abdominal tenderness, vaginal bleeding, contractions on toco, hx preterm delivery  Orders Placed This Encounter  Procedures   Urinalysis, Routine w reflex microscopic Urine, Clean Catch   Discharge patient   Patient Vitals for the past 24 hrs:  BP Temp Pulse Resp SpO2 Height Weight  07/20/21 1948 (!) 93/53 -- 89 16 100 % -- --  07/20/21 1719 (!) 105/54 98 F (36.7 C) 86 16 100 % 5' 5"   (1.651 m) 61 kg   Meds ordered this encounter  Medications   lactated ringers bolus 1,000 mL   acetaminophen (TYLENOL) tablet 650 mg   cyclobenzaprine (FLEXERIL) tablet 10 mg   Results for orders placed or performed during the hospital encounter of 07/20/21 (from the past 24 hour(s))  Urinalysis, Routine w reflex microscopic Urine, Clean Catch     Status: Abnormal   Collection Time: 07/20/21  5:23 PM  Result Value Ref Range   Color, Urine COLORLESS (A) YELLOW   APPearance CLEAR CLEAR   Specific Gravity, Urine 1.003 (L) 1.005 - 1.030   pH 7.0 5.0 - 8.0   Glucose, UA NEGATIVE NEGATIVE mg/dL   Hgb urine dipstick NEGATIVE NEGATIVE   Bilirubin Urine NEGATIVE NEGATIVE   Ketones, ur NEGATIVE NEGATIVE mg/dL   Protein, ur NEGATIVE NEGATIVE mg/dL   Nitrite NEGATIVE NEGATIVE   Leukocytes,Ua NEGATIVE NEGATIVE   Assessment and Plan  --30 y.o. G2P1001 at [redacted]w[redacted]d --Reactive tracing --Closed cervix --Braxton Hicks contractions --Discussed adaptations for activities with two year old to reduce physical strain --Discharge home in stable condition  SDarlina Rumpf CNorth [place]12/09/2020, 8:29 PM

## 2021-07-31 DIAGNOSIS — Z3685 Encounter for antenatal screening for Streptococcus B: Secondary | ICD-10-CM | POA: Diagnosis not present

## 2021-07-31 LAB — OB RESULTS CONSOLE GBS: GBS: NEGATIVE

## 2021-08-14 DIAGNOSIS — O26843 Uterine size-date discrepancy, third trimester: Secondary | ICD-10-CM | POA: Diagnosis not present

## 2021-08-17 DIAGNOSIS — G43909 Migraine, unspecified, not intractable, without status migrainosus: Secondary | ICD-10-CM | POA: Diagnosis not present

## 2021-08-19 NOTE — L&D Delivery Note (Signed)
3Delivery Note At 7:12 AM a viable female was delivered via Vaginal, Spontaneous (Presentation: Right Occiput Anterior).  APGAR: , ; weight  .   Placenta status: Spontaneous, Intact.  Cord: 3 vessels with the following complications: None.  Cord pH: n/a  Anesthesia: Epidural Episiotomy: None Lacerations: None Suture Repair:  none Est. Blood Loss (mL): 200  Mom to postpartum.  Baby to Couplet care / Skin to Skin.  Ala Dach 08/27/2021, 7:21 AM

## 2021-08-21 ENCOUNTER — Telehealth (HOSPITAL_COMMUNITY): Payer: Self-pay | Admitting: *Deleted

## 2021-08-21 NOTE — Telephone Encounter (Signed)
Preadmission screen  

## 2021-08-22 ENCOUNTER — Telehealth (HOSPITAL_COMMUNITY): Payer: Self-pay | Admitting: *Deleted

## 2021-08-22 ENCOUNTER — Encounter (HOSPITAL_COMMUNITY): Payer: Self-pay | Admitting: *Deleted

## 2021-08-22 NOTE — Telephone Encounter (Signed)
Preadmission screen  

## 2021-08-24 ENCOUNTER — Other Ambulatory Visit: Payer: Self-pay | Admitting: Obstetrics

## 2021-08-24 LAB — SARS CORONAVIRUS 2 (TAT 6-24 HRS): SARS Coronavirus 2: NEGATIVE

## 2021-08-26 ENCOUNTER — Other Ambulatory Visit: Payer: Self-pay

## 2021-08-26 NOTE — H&P (Signed)
Kristine Taylor is a 31 y.o. G2P1001 at 53w2dpresenting for IOL. Pt notes rare contractions. Good fetal movement, No vaginal bleeding, not leaking fluid.  Admitted overnight, started on pitocin and entered active labor.   [facility] [place]  - h/[facility]ert[facility], prior [facility] conception, now with spont preg - h/o LEEP, nl CL at 16 wks - bladder dysfunction. Known polyuria/ polydipsia with high bladder volumes but also high volume retention- PVR >600 and pt with frequent self cath through preg - Lymphocytic hypophycitis, DI-type infiltration disease, causes polydipsia, followed by endocrine, responds to DDAVP - h/o pyelo. On macrobid to Keflex - hypothryoidism, stable on synthroid - GERD - Near syncope on several episodes through preg, nl cards eval, final dx of orthostatic hypotensions - Migraine -fetal growth- 37wk- 6'8, 35%   Prenatal Transfer Tool  Maternal Diabetes: No Genetic Screening: Normal Maternal Ultrasounds/Referrals: Normal Fetal Ultrasounds or other Referrals:  None Maternal Substance Abuse:  No Significant Maternal Medications:  Meds include: Syntroid Other:  Significant Maternal Lab Results: Group B Strep negative     OB History     Gravida  2   Para  1   Term  1   Preterm      AB      Living  1      SAB      IAB      Ectopic      Multiple  0   Live Births  1          Past Medical History:  Diagnosis Date   Arthritis    Asthma    Celiac disease?    Permissive genes not present on [facility] testing   Chronic kidney disease    Esophageal dysmotility    GERD (gastroesophageal reflux disease)    Hashimoto's disease    History of stomach ulcers    Hypothyroidism    Lymphocytic hypophysitis (HCC)    Shingles    Vaginal Pap smear, abnormal    Past Surgical History:  Procedure Laterality Date   ESOPHAGEAL MANOMETRY  2017   ESOPHAGOGASTRODUODENOSCOPY  12/2015   LEEP  2018   NASAL SEPTUM SURGERY     NOSE REPAIR   2006   [facility]   Family History: family history includes Colon polyps in her mother; GBerenice Primas disease in her maternal grandfather and mother; Healthy in her sister; Hyperlipidemia in her father; Hypertension in her father. Social History:  reports that she has never smoked. She has never used smokeless tobacco. She reports that she does not currently use alcohol. She reports that she does not use drugs.  Review of Systems - Negative except polydipsia   Physical Exam:  Vitals:   08/27/21 0600 08/27/21 0603 08/27/21 0608 08/27/21 0610  BP:  (!) 119/59 114/69 111/61  Pulse:  85 83 88  Resp: 16 18  16   Temp:      TempSrc:      Weight:      Height:        Gen: well appearing, no distress  Back: no CVAT Abd: gravid, NT, no RUQ pain LE: no edema, equal bilaterally, non-tender Toco: q 2 min FH: baseline 130s, accelerations present, no deceleratons, 10 beat variability Cvx:5/90/vtx -2/ AROM clear  Prenatal labs: ABO, Rh: A/Positive/-- (05/10 0000) Antibody: Negative (05/10 0000) Rubella: Immune (06/27 0000) RPR: Nonreactive (06/27 0000)  HBsAg: Negative (06/27 0000)  HIV: Non-reactive (06/27 0000)  GBS: Negative/-- (12/13 0000)  1 hr Glucola 96  Genetic screening normal NT, nl AFP Anatomy US normal   Assessment/Plan: 31 y.o. G2P1001 at 83w1dIOL at term, plan pitocin 2x2, AROM when active GBS neg Hypothyroid, cont synthroid Polyuria, polydipsia. Watch I/O's, DDAVP if needed Urinary retention. Keep foley in at least 24 hrs after epidural, then will need trial of void and be prepared for self cath.   Reactive fetal testing  KAla Dach1/04/2022 6:32 AM

## 2021-08-27 ENCOUNTER — Inpatient Hospital Stay (HOSPITAL_COMMUNITY): Payer: BC Managed Care – PPO

## 2021-08-27 ENCOUNTER — Inpatient Hospital Stay (HOSPITAL_COMMUNITY): Payer: BC Managed Care – PPO | Admitting: Anesthesiology

## 2021-08-27 ENCOUNTER — Encounter (HOSPITAL_COMMUNITY): Payer: Self-pay | Admitting: Obstetrics

## 2021-08-27 ENCOUNTER — Inpatient Hospital Stay (HOSPITAL_COMMUNITY)
Admission: AD | Admit: 2021-08-27 | Discharge: 2021-08-28 | DRG: 807 | Disposition: A | Discharge status: Home / Self Care | Payer: BC Managed Care – PPO | Attending: Obstetrics | Admitting: Obstetrics

## 2021-08-27 DIAGNOSIS — N319 Neuromuscular dysfunction of bladder, unspecified: Secondary | ICD-10-CM | POA: Diagnosis present

## 2021-08-27 DIAGNOSIS — E039 Hypothyroidism, unspecified: Secondary | ICD-10-CM | POA: Diagnosis present

## 2021-08-27 DIAGNOSIS — Z3A39 39 weeks gestation of pregnancy: Secondary | ICD-10-CM | POA: Diagnosis not present

## 2021-08-27 DIAGNOSIS — O26893 Other specified pregnancy related conditions, third trimester: Secondary | ICD-10-CM | POA: Diagnosis not present

## 2021-08-27 DIAGNOSIS — O99284 Endocrine, nutritional and metabolic diseases complicating childbirth: Principal | ICD-10-CM | POA: Diagnosis present

## 2021-08-27 DIAGNOSIS — Z3490 Encounter for supervision of normal pregnancy, unspecified, unspecified trimester: Secondary | ICD-10-CM | POA: Diagnosis present

## 2021-08-27 LAB — COMPREHENSIVE METABOLIC PANEL
ALT: 13 U/L (ref 0–44)
AST: 21 U/L (ref 15–41)
Albumin: 2.8 g/dL — ABNORMAL LOW (ref 3.5–5.0)
Alkaline Phosphatase: 157 U/L — ABNORMAL HIGH (ref 38–126)
Anion gap: 6 (ref 5–15)
BUN: 7 mg/dL (ref 6–20)
CO2: 22 mmol/L (ref 22–32)
Calcium: 8.4 mg/dL — ABNORMAL LOW (ref 8.9–10.3)
Chloride: 110 mmol/L (ref 98–111)
Creatinine, Ser: 0.5 mg/dL (ref 0.44–1.00)
GFR, Estimated: >60 mL/min (ref >60)
Glucose, Bld: 100 mg/dL — ABNORMAL HIGH (ref 70–99)
Potassium: 3.6 mmol/L (ref 3.5–5.1)
Sodium: 138 mmol/L (ref 135–145)
Total Bilirubin: 0.5 mg/dL (ref 0.3–1.2)
Total Protein: 5.8 g/dL — ABNORMAL LOW (ref 6.5–8.1)

## 2021-08-27 LAB — CBC
HCT: 34.2 % — ABNORMAL LOW (ref 36.0–46.0)
Hemoglobin: 11.4 g/dL — ABNORMAL LOW (ref 12.0–15.0)
MCH: 29.8 pg (ref 26.0–34.0)
MCHC: 33.3 g/dL (ref 30.0–36.0)
MCV: 89.5 fL (ref 80.0–100.0)
Platelets: 297 10*3/uL (ref 150–400)
RBC: 3.82 MIL/uL — ABNORMAL LOW (ref 3.87–5.11)
RDW: 13.3 % (ref 11.5–15.5)
WBC: 9.7 10*3/uL (ref 4.0–10.5)
nRBC: 0 % (ref 0.0–0.2)

## 2021-08-27 LAB — TYPE AND SCREEN
ABO/RH(D): A POS
Antibody Screen: NEGATIVE

## 2021-08-27 LAB — RPR: RPR Ser Ql: NONREACTIVE

## 2021-08-27 MED ORDER — LIDOCAINE-EPINEPHRINE (PF) 1.5 %-1:200000 IJ SOLN
INTRAMUSCULAR | Status: DC | PRN
  Administered 2021-08-27: 5 mL via EPIDURAL

## 2021-08-27 MED ORDER — LEVOTHYROXINE SODIUM 112 MCG PO TABS
112.0000 ug | ORAL_TABLET | Freq: Every day | ORAL | Status: DC
  Administered 2021-08-27 – 2021-08-28 (×2): 112 ug via ORAL
  Filled 2021-08-27 (×2): qty 1

## 2021-08-27 MED ORDER — LIDOCAINE HCL (PF) 1 % IJ SOLN: 30.0000 mL | INTRAMUSCULAR | Status: DC | PRN

## 2021-08-27 MED ORDER — ACETAMINOPHEN 325 MG PO TABS: 650.0000 mg | ORAL_TABLET | ORAL | Status: DC | PRN

## 2021-08-27 MED ORDER — OXYTOCIN-SODIUM CHLORIDE 30-0.9 UT/500ML-% IV SOLN: 2.5000 [IU]/h | INTRAVENOUS | Status: DC

## 2021-08-27 MED ORDER — NITROFURANTOIN MONOHYD MACRO 100 MG PO CAPS
100.0000 mg | ORAL_CAPSULE | Freq: Every day | ORAL | Status: DC
  Administered 2021-08-27: 100 mg via ORAL
  Filled 2021-08-27 (×2): qty 1

## 2021-08-27 MED ORDER — EPHEDRINE 5 MG/ML INJ: 10.0000 mg | INTRAVENOUS | Status: DC | PRN

## 2021-08-27 MED ORDER — PANTOPRAZOLE SODIUM 40 MG PO TBEC
40.0000 mg | DELAYED_RELEASE_TABLET | Freq: Every day | ORAL | Status: DC
  Administered 2021-08-28: 40 mg via ORAL
  Filled 2021-08-27: qty 1

## 2021-08-27 MED ORDER — TERBUTALINE SULFATE 1 MG/ML IJ SOLN: 0.2500 mg | Freq: Once | INTRAMUSCULAR | Status: DC | PRN

## 2021-08-27 MED ORDER — OXYTOCIN-SODIUM CHLORIDE 30-0.9 UT/500ML-% IV SOLN
1.0000 m[IU]/min | INTRAVENOUS | Status: DC
  Administered 2021-08-27: 2 m[IU]/min via INTRAVENOUS
  Filled 2021-08-27: qty 500

## 2021-08-27 MED ORDER — LACTATED RINGERS IV SOLN: 500.0000 mL | INTRAVENOUS | Status: DC | PRN

## 2021-08-27 MED ORDER — LACTATED RINGERS IV SOLN: INTRAVENOUS | Status: DC

## 2021-08-27 MED ORDER — SIMETHICONE 80 MG PO CHEW: 80.0000 mg | CHEWABLE_TABLET | ORAL | Status: DC | PRN

## 2021-08-27 MED ORDER — IBUPROFEN 600 MG PO TABS
600.0000 mg | ORAL_TABLET | Freq: Four times a day (QID) | ORAL | Status: DC
  Administered 2021-08-27 – 2021-08-28 (×3): 600 mg via ORAL
  Filled 2021-08-27 (×4): qty 1

## 2021-08-27 MED ORDER — DIBUCAINE (PERIANAL) 1 % EX OINT: 1.0000 "application " | TOPICAL_OINTMENT | CUTANEOUS | Status: DC | PRN

## 2021-08-27 MED ORDER — LEVOTHYROXINE SODIUM 112 MCG PO TABS: 112.0000 ug | ORAL_TABLET | Freq: Every day | ORAL | Status: DC

## 2021-08-27 MED ORDER — COCONUT OIL OIL: 1.0000 "application " | TOPICAL_OIL | Status: DC | PRN

## 2021-08-27 MED ORDER — OXYCODONE HCL 5 MG PO TABS: 5.0000 mg | ORAL_TABLET | ORAL | Status: DC | PRN

## 2021-08-27 MED ORDER — OXYTOCIN BOLUS FROM INFUSION
333.0000 mL | Freq: Once | INTRAVENOUS | Status: AC
  Administered 2021-08-27: 333 mL via INTRAVENOUS

## 2021-08-27 MED ORDER — LIDOCAINE HCL (PF) 1 % IJ SOLN
INTRAMUSCULAR | Status: DC | PRN
  Administered 2021-08-27: 5 mL via EPIDURAL

## 2021-08-27 MED ORDER — ZOLPIDEM TARTRATE 5 MG PO TABS: 5.0000 mg | ORAL_TABLET | Freq: Every evening | ORAL | Status: DC | PRN

## 2021-08-27 MED ORDER — LACTATED RINGERS IV SOLN: 500.0000 mL | Freq: Once | INTRAVENOUS | Status: DC

## 2021-08-27 MED ORDER — ONDANSETRON HCL 4 MG/2ML IJ SOLN: 4.0000 mg | INTRAMUSCULAR | Status: DC | PRN

## 2021-08-27 MED ORDER — PHENYLEPHRINE 40 MCG/ML (10ML) SYRINGE FOR IV PUSH (FOR BLOOD PRESSURE SUPPORT)
80.0000 ug | PREFILLED_SYRINGE | INTRAVENOUS | Status: DC | PRN
  Filled 2021-08-27: qty 10

## 2021-08-27 MED ORDER — DIPHENHYDRAMINE HCL 25 MG PO CAPS: 25.0000 mg | ORAL_CAPSULE | Freq: Four times a day (QID) | ORAL | Status: DC | PRN

## 2021-08-27 MED ORDER — BENZOCAINE-MENTHOL 20-0.5 % EX AERO: 1.0000 "application " | INHALATION_SPRAY | CUTANEOUS | Status: DC | PRN

## 2021-08-27 MED ORDER — FENTANYL-BUPIVACAINE-NACL 0.5-0.125-0.9 MG/250ML-% EP SOLN
12.0000 mL/h | EPIDURAL | Status: DC | PRN
  Administered 2021-08-27: 12 mL/h via EPIDURAL
  Filled 2021-08-27: qty 250

## 2021-08-27 MED ORDER — ONDANSETRON HCL 4 MG/2ML IJ SOLN: 4.0000 mg | Freq: Four times a day (QID) | INTRAMUSCULAR | Status: DC | PRN

## 2021-08-27 MED ORDER — OXYCODONE HCL 5 MG PO TABS: 10.0000 mg | ORAL_TABLET | ORAL | Status: DC | PRN

## 2021-08-27 MED ORDER — PHENYLEPHRINE 40 MCG/ML (10ML) SYRINGE FOR IV PUSH (FOR BLOOD PRESSURE SUPPORT): 80.0000 ug | PREFILLED_SYRINGE | INTRAVENOUS | Status: DC | PRN

## 2021-08-27 MED ORDER — PRENATAL MULTIVITAMIN CH
1.0000 | ORAL_TABLET | Freq: Every day | ORAL | Status: DC
  Administered 2021-08-27 – 2021-08-28 (×2): 1 via ORAL
  Filled 2021-08-27 (×2): qty 1

## 2021-08-27 MED ORDER — SENNOSIDES-DOCUSATE SODIUM 8.6-50 MG PO TABS
2.0000 | ORAL_TABLET | ORAL | Status: DC
  Administered 2021-08-28: 2 via ORAL
  Filled 2021-08-27: qty 2

## 2021-08-27 MED ORDER — ONDANSETRON HCL 4 MG PO TABS: 4.0000 mg | ORAL_TABLET | ORAL | Status: DC | PRN

## 2021-08-27 MED ORDER — TETANUS-DIPHTH-ACELL PERTUSSIS 5-2.5-18.5 LF-MCG/0.5 IM SUSY: 0.5000 mL | PREFILLED_SYRINGE | Freq: Once | INTRAMUSCULAR | Status: DC

## 2021-08-27 MED ORDER — SOD CITRATE-CITRIC ACID 500-334 MG/5ML PO SOLN: 30.0000 mL | ORAL | Status: DC | PRN

## 2021-08-27 MED ORDER — DIPHENHYDRAMINE HCL 50 MG/ML IJ SOLN: 12.5000 mg | INTRAMUSCULAR | Status: DC | PRN

## 2021-08-27 MED ORDER — WITCH HAZEL-GLYCERIN EX PADS: 1.0000 "application " | MEDICATED_PAD | CUTANEOUS | Status: DC | PRN

## 2021-08-27 NOTE — Anesthesia Procedure Notes (Signed)
Epidural Patient location during procedure: OB Start time: 08/27/2021 5:34 AM End time: 08/27/2021 5:48 AM  Staffing Anesthesiologist: Darral Dash, DO Performed: anesthesiologist   Preanesthetic Checklist Completed: patient identified, IV checked, site marked, risks and benefits discussed, surgical consent, monitors and equipment checked, pre-op evaluation and timeout performed  Epidural Patient position: sitting Prep: ChloraPrep Patient monitoring: heart rate, continuous pulse ox and blood pressure Approach: midline Location: L4-L5 Injection technique: LOR saline  Needle:  Needle type: Tuohy  Needle gauge: 17 G Needle length: 9 cm Needle insertion depth: 6 cm Catheter type: closed end flexible Catheter size: 20 Guage Catheter at skin depth: 11 cm Test dose: negative and 1.5% lidocaine with Epi 1:200 K  Assessment Events: blood not aspirated, injection not painful, no injection resistance and no paresthesia  Additional Notes Patient identified. Risks/Benefits/Options discussed with patient including but not limited to bleeding, infection, nerve damage, paralysis, failed block, incomplete pain control, headache, blood pressure changes, nausea, vomiting, reactions to medications, itching and postpartum back pain. Confirmed with bedside nurse the patient's most recent platelet count. Confirmed with patient that they are not currently taking any anticoagulation, have any bleeding history or any family history of bleeding disorders. Patient expressed understanding and wished to proceed. All questions were answered. Sterile technique was used throughout the entire procedure. Please see nursing notes for vital signs. Test dose was given through epidural catheter and negative prior to continuing to dose epidural or start infusion. Warning signs of high block given to the patient including shortness of breath, tingling/numbness in hands, complete motor block, or any concerning symptoms  with instructions to call for help. Patient was given instructions on fall risk and not to get out of bed. All questions and concerns addressed with instructions to call with any issues or inadequate analgesia.    Reason for block:procedure for pain

## 2021-08-27 NOTE — Anesthesia Preprocedure Evaluation (Signed)
Anesthesia Evaluation  Patient identified by MRN, date of birth, ID band Patient awake    Reviewed: Allergy & Precautions, NPO status , Patient's Chart, lab work & pertinent test results  Airway Mallampati: I  TM Distance: >3 FB Neck ROM: Full    Dental no notable dental hx.    Pulmonary asthma ,    Pulmonary exam normal        Cardiovascular negative cardio ROS   Rhythm:Regular Rate:Normal     Neuro/Psych Anxiety negative neurological ROS     GI/Hepatic Neg liver ROS, GERD  Medicated,  Endo/Other  Hypothyroidism   Renal/GU   negative genitourinary   Musculoskeletal  (+) Arthritis , Osteoarthritis,    Abdominal Normal abdominal exam  (+)   Peds  Hematology negative hematology ROS (+)   Anesthesia Other Findings   Reproductive/Obstetrics (+) Pregnancy                             Anesthesia Physical Anesthesia Plan  ASA: 2  Anesthesia Plan: Epidural   Post-op Pain Management:    Induction:   PONV Risk Score and Plan: 2 and Treatment may vary due to age or medical condition  Airway Management Planned: Natural Airway  Additional Equipment: None  Intra-op Plan:   Post-operative Plan:   Informed Consent: I have reviewed the patients History and Physical, chart, labs and discussed the procedure including the risks, benefits and alternatives for the proposed anesthesia with the patient or authorized representative who has indicated his/her understanding and acceptance.     Dental advisory given  Plan Discussed with:   Anesthesia Plan Comments: (Lab Results      Component                Value               Date                      WBC                      9.7                 08/27/2021                HGB                      11.4 (L)            08/27/2021                HCT                      34.2 (L)            08/27/2021                MCV                      89.5                 08/27/2021                PLT                      297                 08/27/2021          )  Anesthesia Quick Evaluation

## 2021-08-27 NOTE — Lactation Note (Signed)
This note was copied from a baby's chart. Lactation Consultation Note  Patient Name: Girl Kehaulani Fruin NGEXB'M Date: 08/27/2021 Reason for consult: Follow-up assessment Age:32 hours  P2, Baby has been sleepy at the breast. Suggest hand expressing and spoon feeding until baby latches.  Observed good flow of colostrum. Mother states she is having more difficulties with latching on L side.  Suggest she prepump with manual pump. Attempted latching in cradle and football hold but baby sleepy. Mother will place STS. Feed on demand with cues.  Goal 8-12+ times per day after first 24 hrs.  Place baby STS if not cueing.  Suggest mother call if assistance is needed.    Maternal Data Has patient been taught Hand Expression?: Yes Does the patient have breastfeeding experience prior to this delivery?: Yes How long did the patient breastfeed?: 15 months  Feeding Mother's Current Feeding Choice: Breast Milk   Interventions Interventions: Breast feeding basics reviewed;Assisted with latch;Skin to skin;Hand express;Pre-pump if needed;Hand pump;Education  Discharge Pump: Personal [place] Program: No  Consult Status Consult Status: Follow-up Date: 08/28/21 Follow-up type: In-patient    Vivianne Master [facility] 08/27/2021, 12:11 PM

## 2021-08-27 NOTE — Lactation Note (Signed)
This note was copied from a baby's chart. Lactation Consultation Note  Patient Name: Kristine Taylor ANVBT'Y Date: 08/27/2021 Reason for consult: Initial assessment;Term Age:32 hours  P2 mother whose infant is now 57 hours old.  This is a term baby at 39+2 weeks.  Mother breast fed her first child (now 24 years old) for 15 months.  Mother's current feeding preference is breast.  Baby "Kristine Taylor" breast fed well after birth.  Mother reported a good latch (LATCH score of a 9) and denied pain with latching. Reviewed breast feeding basics.  Encouraged STS and hand expression before/after feedings to help ensure a good milk supply.  Mother reports that she has been leaking.  Suggested she feed 8-12 times/24 hours or sooner if baby shows cues.  Mother will call for latch assistance as needed.  Father present.   Maternal Data Has patient been taught Hand Expression?: Yes Does the patient have breastfeeding experience prior to this delivery?: Yes How long did the patient breastfeed?: 15 months  Feeding Mother's Current Feeding Choice: Breast Milk  LATCH Score Latch: Grasps breast easily, tongue down, lips flanged, rhythmical sucking.  Audible Swallowing: A few with stimulation  Type of Nipple: Everted at rest and after stimulation  Comfort (Breast/Nipple): Soft / non-tender  Hold (Positioning): No assistance needed to correctly position infant at breast.  LATCH Score: 9   Lactation Tools Discussed/Used    Interventions Interventions: Breast feeding basics reviewed;Education  Discharge Pump: Personal WIC Program: No  Consult Status Consult Status: Follow-up Date: 08/28/21 Follow-up type: In-patient    Caidynce Muzyka R Vita Currin 08/27/2021, 11:00 AM

## 2021-08-27 NOTE — Anesthesia Postprocedure Evaluation (Signed)
Anesthesia Post Note  Patient: Kristine Taylor  Procedure(s) Performed: AN AD HOC LABOR EPIDURAL     Patient location during evaluation: Mother Baby Anesthesia Type: Epidural Level of consciousness: awake Pain management: satisfactory to patient Vital Signs Assessment: post-procedure vital signs reviewed and stable Respiratory status: spontaneous breathing Cardiovascular status: stable Anesthetic complications: no   No notable events documented.  Last Vitals:  Vitals:   08/27/21 0900 08/27/21 1015  BP: 93/63 97/62  Pulse: 73 82  Resp: 20 16  Temp: 37 C 36.8 C  SpO2: 99% 98%    Last Pain:  Vitals:   08/27/21 1015  TempSrc: Oral  PainSc: 0-No pain   Pain Goal:                   [employer]

## 2021-08-28 NOTE — Discharge Summary (Signed)
OB Discharge Summary  Patient Name: Kristine Taylor DOB: 12-Apr-1991 MRN: [id number]  Date of admission: 08/27/2021 Delivering provider: Aloha Gell   Admitting diagnosis: Encounter for induction of labor [Z34.90] Intrauterine pregnancy: [redacted]w[redacted]d    Secondary diagnosis: Patient Active Problem List   Diagnosis Date Noted   Encounter for induction of labor 08/27/2021   SVD 1/9 08/27/2021   Postpartum care following vaginal delivery 1/9 08/27/2021   Bladder dysfunction 08/27/2021   Agoraphobia 06/23/2020   Celiac disease 09/04/2018   Acquired hypothyroidism 09/04/2018   Arthritis 09/04/2018    Date of discharge: 08/28/2021   Discharge diagnosis: Principal Problem:   Postpartum care following vaginal delivery 1/9 Active Problems:   Acquired hypothyroidism   Encounter for induction of labor   SVD 1/9   Bladder dysfunction                                                           Post partum procedures: None  Augmentation: AROM and Pitocin Pain control: Epidural  Laceration:None  Episiotomy:None  Complications: None  Hospital course:  Induction of Labor With Vaginal Delivery   31y.o. yo G2P2002 at 326w2dasas admitted to the hospital 08/27/2021 for induction of labor.  Indication for induction: Favorable cervix at term.  Patient had an uncomplicated labor course as follows: Membrane Rupture Time/Date: 6:26 AM ,08/27/2021   Delivery Method:Vaginal, Spontaneous  Episiotomy: None  Lacerations:  None  Details of delivery can be found in separate delivery note.  Patient had a routine postpartum course. Patient is discharged home 08/28/21.  Newborn Data: Birth date:08/27/2021  Birth time:7:12 AM  Gender:Female  Living status:Living  Apgars:8 ,9  Weight:3110 g   Physical exam  Vitals:   08/27/21 1015 08/27/21 1340 08/27/21 2200 08/28/21 0607  BP: 97/62 96/63 97/60  101/72  Pulse: 82 80 74 82  Resp: 16 18 18 18   Temp: 98.2 F (36.8 C) 98.2 F (36.8 C) 97.9 F (36.6 C) 98.5  F (36.9 C)  TempSrc: Oral Oral Oral Oral  SpO2: 98% 100%  98%  Weight:      Height:       General: alert, cooperative, and no distress Lochia: appropriate Uterine Fundus: firm Incision: N/A Perineum: none DVT Evaluation: No evidence of DVT seen on physical exam.  Labs: Lab Results  Component Value Date   WBC 9.7 08/27/2021   HGB 11.4 (L) 08/27/2021   HCT 34.2 (L) 08/27/2021   MCV 89.5 08/27/2021   PLT 297 08/27/2021   CMP Latest Ref Rng & Units 08/27/2021  Glucose 70 - 99 mg/dL 100(H)  BUN 6 - 20 mg/dL 7  Creatinine 0.44 - 1.00 mg/dL 0.50  Sodium 135 - 145 mmol/L 138  Potassium 3.5 - 5.1 mmol/L 3.6  Chloride 98 - 111 mmol/L 110  CO2 22 - 32 mmol/L 22  Calcium 8.9 - 10.3 mg/dL 8.4(L)  Total Protein 6.5 - 8.1 g/dL 5.8(L)  Total Bilirubin 0.3 - 1.2 mg/dL 0.5  Alkaline Phos 38 - 126 U/L 157(H)  AST 15 - 41 U/L 21  ALT 0 - 44 U/L 13   Edinburgh Postnatal Depression Scale Screening Tool 07/25/2019  I have been able to laugh and see the funny side of things. 0  I have looked forward with enjoyment to things. 0  I have blamed  myself unnecessarily when things went wrong. 2  I have been anxious or worried for no good reason. 2  I have felt scared or panicky for no good reason. 0  Things have been getting on top of me. 1  I have been so unhappy that I have had difficulty sleeping. 0  I have felt sad or miserable. 0  I have been so unhappy that I have been crying. 0  The thought of harming myself has occurred to me. 0  Edinburgh Postnatal Depression Scale Total 5   Discharge instructions:  per After Visit Summary  After Visit Meds:  Allergies as of 08/28/2021       Reactions   Gluten Meal    Imitrex [sumatriptan]    Jaw and shoulder pain         Medication List     STOP taking these medications    dexlansoprazole 60 MG capsule Commonly known as: DEXILANT   famotidine 20 MG tablet Commonly known as: PEPCID   metoCLOPramide 10 MG tablet Commonly known as:  REGLAN   nitrofurantoin (macrocrystal-monohydrate) 100 MG capsule Commonly known as: MACROBID       TAKE these medications    levothyroxine 112 MCG tablet Commonly known as: SYNTHROID Take 112 mcg by mouth daily before breakfast.   multivitamin-prenatal 27-0.8 MG Tabs tablet Take 1 tablet by mouth at bedtime.   PROBIOTIC PO Take 1 capsule by mouth daily.       Diet: routine diet  Activity: Advance as tolerated. Pelvic rest for 6 weeks.   Newborn Data: Live born female  Birth Weight: 6 lb 13.7 oz (3110 g) APGAR: 32, 9  Newborn Delivery   Birth date/time: 08/27/2021 07:12:00 Delivery type: Vaginal, Spontaneous     Named Rose Baby Feeding: Breast Disposition:home with mother  Delivery Report:  Review the Delivery Report for details.    Follow up:  Follow-up Information     Aloha Gell, MD. Schedule an appointment as soon as possible for a visit in 6 week(s).   Specialty: Obstetrics and Gynecology Contact information: [place] [address] [place], CNM, MSN 08/28/2021, 10:23 AM

## 2021-09-01 ENCOUNTER — Inpatient Hospital Stay (HOSPITAL_COMMUNITY): Admit: 2021-09-01 | Payer: Self-pay

## 2021-09-10 ENCOUNTER — Telehealth (HOSPITAL_COMMUNITY): Payer: Self-pay | Admitting: *Deleted

## 2021-09-10 NOTE — Telephone Encounter (Signed)
Attempted hospital discharge follow-up call. Left message for patient to return RN call. Erline Levine, RN, 09/10/21, [phone]

## 2021-10-08 DIAGNOSIS — E039 Hypothyroidism, unspecified: Secondary | ICD-10-CM | POA: Diagnosis not present

## 2021-10-08 DIAGNOSIS — N939 Abnormal uterine and vaginal bleeding, unspecified: Secondary | ICD-10-CM | POA: Diagnosis not present

## 2021-10-12 ENCOUNTER — Ambulatory Visit: Payer: BC Managed Care – PPO | Admitting: Cardiology

## 2021-10-15 DIAGNOSIS — E039 Hypothyroidism, unspecified: Secondary | ICD-10-CM | POA: Diagnosis not present

## 2021-10-18 DIAGNOSIS — N939 Abnormal uterine and vaginal bleeding, unspecified: Secondary | ICD-10-CM | POA: Insufficient documentation

## 2021-10-26 DIAGNOSIS — N939 Abnormal uterine and vaginal bleeding, unspecified: Secondary | ICD-10-CM | POA: Diagnosis not present

## 2021-11-21 DIAGNOSIS — N939 Abnormal uterine and vaginal bleeding, unspecified: Secondary | ICD-10-CM | POA: Diagnosis not present

## 2021-11-21 DIAGNOSIS — Z3202 Encounter for pregnancy test, result negative: Secondary | ICD-10-CM | POA: Diagnosis not present

## 2021-12-13 DIAGNOSIS — E039 Hypothyroidism, unspecified: Secondary | ICD-10-CM | POA: Diagnosis not present

## 2022-01-11 DIAGNOSIS — M25531 Pain in right wrist: Secondary | ICD-10-CM | POA: Insufficient documentation
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
## 2022-01-28 ENCOUNTER — Ambulatory Visit: Payer: BC Managed Care – PPO | Admitting: Neurology

## 2022-01-28 ENCOUNTER — Encounter: Payer: Self-pay | Admitting: Neurology

## 2022-01-28 DIAGNOSIS — Z029 Encounter for administrative examinations, unspecified: Secondary | ICD-10-CM

## 2022-02-11 ENCOUNTER — Telehealth: Payer: Self-pay

## 2022-02-11 MED ORDER — DEXLANSOPRAZOLE 60 MG PO CPDR: 60.0000 mg | DELAYED_RELEASE_CAPSULE | Freq: Every day | ORAL | 3 refills | Status: DC

## 2022-02-11 NOTE — Telephone Encounter (Signed)
I spoke with Kristine Taylor to confirm that she is on the Dexilant. She is and request we send a refill to [facility] fax # [phone]. Form faxed back to them, fax # [phone].

## 2022-02-18 DIAGNOSIS — Z6821 Body mass index (BMI) 21.0-21.9, adult: Secondary | ICD-10-CM | POA: Diagnosis not present

## 2022-02-18 DIAGNOSIS — Z01419 Encounter for gynecological examination (general) (routine) without abnormal findings: Secondary | ICD-10-CM | POA: Diagnosis not present

## 2022-02-18 DIAGNOSIS — R5383 Other fatigue: Secondary | ICD-10-CM | POA: Diagnosis not present

## 2022-05-09 DIAGNOSIS — O9122 Nonpurulent mastitis associated with the puerperium: Secondary | ICD-10-CM | POA: Diagnosis not present

## 2022-05-10 DIAGNOSIS — O9123 Nonpurulent mastitis associated with lactation: Secondary | ICD-10-CM | POA: Insufficient documentation

## 2022-05-17 DIAGNOSIS — O9122 Nonpurulent mastitis associated with the puerperium: Secondary | ICD-10-CM | POA: Diagnosis not present

## 2022-08-06 DIAGNOSIS — L0232 Furuncle of buttock: Secondary | ICD-10-CM | POA: Diagnosis not present

## 2022-08-06 DIAGNOSIS — D225 Melanocytic nevi of trunk: Secondary | ICD-10-CM | POA: Diagnosis not present

## 2022-08-06 DIAGNOSIS — D492 Neoplasm of unspecified behavior of bone, soft tissue, and skin: Secondary | ICD-10-CM | POA: Diagnosis not present

## 2022-08-06 DIAGNOSIS — L308 Other specified dermatitis: Secondary | ICD-10-CM | POA: Diagnosis not present

## 2022-08-06 DIAGNOSIS — L858 Other specified epidermal thickening: Secondary | ICD-10-CM | POA: Diagnosis not present

## 2022-08-16 DIAGNOSIS — E063 Autoimmune thyroiditis: Secondary | ICD-10-CM | POA: Diagnosis not present

## 2022-12-04 DIAGNOSIS — R5383 Other fatigue: Secondary | ICD-10-CM | POA: Diagnosis not present

## 2022-12-24 ENCOUNTER — Ambulatory Visit: Payer: Self-pay | Admitting: Family Medicine

## 2023-01-27 ENCOUNTER — Other Ambulatory Visit: Payer: Self-pay

## 2023-01-27 ENCOUNTER — Encounter (HOSPITAL_BASED_OUTPATIENT_CLINIC_OR_DEPARTMENT_OTHER): Payer: Self-pay | Admitting: Emergency Medicine

## 2023-01-27 ENCOUNTER — Emergency Department (HOSPITAL_BASED_OUTPATIENT_CLINIC_OR_DEPARTMENT_OTHER)
Admission: EM | Admit: 2023-01-27 | Discharge: 2023-01-27 | Disposition: A | Discharge status: Home / Self Care | Payer: BC Managed Care – PPO | Attending: Emergency Medicine | Admitting: Emergency Medicine

## 2023-01-27 DIAGNOSIS — R42 Dizziness and giddiness: Secondary | ICD-10-CM | POA: Diagnosis not present

## 2023-01-27 DIAGNOSIS — Z79899 Other long term (current) drug therapy: Secondary | ICD-10-CM | POA: Insufficient documentation

## 2023-01-27 LAB — CBC WITH DIFFERENTIAL/PLATELET
Abs Immature Granulocytes: 0.01 10*3/uL (ref 0.00–0.07)
Basophils Absolute: 0 10*3/uL (ref 0.0–0.1)
Basophils Relative: 1 %
Eosinophils Absolute: 0.1 10*3/uL (ref 0.0–0.5)
Eosinophils Relative: 2 %
HCT: 39 % (ref 36.0–46.0)
Hemoglobin: 13.4 g/dL (ref 12.0–15.0)
Immature Granulocytes: 0 %
Lymphocytes Relative: 31 %
Lymphs Abs: 2.2 10*3/uL (ref 0.7–4.0)
MCH: 31 pg (ref 26.0–34.0)
MCHC: 34.4 g/dL (ref 30.0–36.0)
MCV: 90.3 fL (ref 80.0–100.0)
Monocytes Absolute: 0.5 10*3/uL (ref 0.1–1.0)
Monocytes Relative: 7 %
Neutro Abs: 4.3 10*3/uL (ref 1.7–7.7)
Neutrophils Relative %: 59 %
Platelets: 224 10*3/uL (ref 150–400)
RBC: 4.32 MIL/uL (ref 3.87–5.11)
RDW: 13 % (ref 11.5–15.5)
WBC: 7.1 10*3/uL (ref 4.0–10.5)
nRBC: 0 % (ref 0.0–0.2)

## 2023-01-27 LAB — BASIC METABOLIC PANEL
Anion gap: 9 (ref 5–15)
BUN: 11 mg/dL (ref 6–20)
CO2: 25 mmol/L (ref 22–32)
Calcium: 9.5 mg/dL (ref 8.9–10.3)
Chloride: 107 mmol/L (ref 98–111)
Creatinine, Ser: 0.56 mg/dL (ref 0.44–1.00)
GFR, Estimated: >60 mL/min (ref >60)
Glucose, Bld: 94 mg/dL (ref 70–99)
Potassium: 3.8 mmol/L (ref 3.5–5.1)
Sodium: 141 mmol/L (ref 135–145)

## 2023-01-27 LAB — PREGNANCY, URINE: Preg Test, Ur: NEGATIVE

## 2023-01-27 LAB — MAGNESIUM: Magnesium: 1.8 mg/dL (ref 1.7–2.4)

## 2023-01-27 MED ORDER — SODIUM CHLORIDE 0.9 % IV BOLUS
1000.0000 mL | Freq: Once | INTRAVENOUS | Status: AC
  Administered 2023-01-27: 1000 mL via INTRAVENOUS

## 2023-01-27 MED ORDER — PROCHLORPERAZINE EDISYLATE 10 MG/2ML IJ SOLN
10.0000 mg | Freq: Once | INTRAMUSCULAR | Status: AC
  Administered 2023-01-27: 10 mg via INTRAVENOUS
  Filled 2023-01-27: qty 2

## 2023-01-27 MED ORDER — MECLIZINE HCL 25 MG PO TABS
25.0000 mg | ORAL_TABLET | Freq: Once | ORAL | Status: AC
  Administered 2023-01-27: 25 mg via ORAL
  Filled 2023-01-27: qty 1

## 2023-01-27 MED ORDER — DEXAMETHASONE 4 MG PO TABS
10.0000 mg | ORAL_TABLET | Freq: Once | ORAL | Status: AC
Start: 2023-01-27 — End: 2023-01-27
  Administered 2023-01-27: 10 mg via ORAL
  Filled 2023-01-27: qty 3

## 2023-01-27 MED ORDER — MECLIZINE HCL 25 MG PO TABS: 25.0000 mg | ORAL_TABLET | Freq: Three times a day (TID) | ORAL | 0 refills | Status: DC | PRN

## 2023-01-27 MED ORDER — DIPHENHYDRAMINE HCL 50 MG/ML IJ SOLN
25.0000 mg | Freq: Once | INTRAMUSCULAR | Status: AC
  Administered 2023-01-27: 25 mg via INTRAVENOUS
  Filled 2023-01-27: qty 1

## 2023-01-27 NOTE — ED Notes (Signed)
Discharge instructions, follow up care, and prescriptions reviewed and explained pt verbalized understanding and had no further questions on d/c. Pt caox4, ambulatory, NAD on d/c.

## 2023-01-27 NOTE — Discharge Instructions (Addendum)
Please call your neurologist today and let them know that this happened to you.  See when they can see you in the office.  Please return for worsening symptoms especially if you are unable to walk or if you develop one-sided numbness or weakness or difficulty with speech or swallowing.

## 2023-01-27 NOTE — ED Triage Notes (Signed)
Pt arrived POV, caox4, ambulatory, NAD. Pt c/o dizziness, unsteady gait, and blurred vision upon waking around 0530 this morning. Denies hx vertigo. Hx migraines. Pt denies any recent falls or trauma to the head. Pt denies numbness/weakness/tingling in extremities.

## 2023-01-27 NOTE — ED Provider Notes (Signed)
[place]  EMERGENCY DEPARTMENT AT [facility] Provider Note   CSN: [id number] Arrival date & time: 01/27/23  1229     History  Chief Complaint  Patient presents with   Dizziness    Kristine Taylor is a 32 y.o. female.  31 yo F with a chief complaints of dizziness.  She woke up this morning and felt unsteady.  She Feels like she is on about and feels like when she tries to walk the world is a bit unsteady.  She also feels that her vision is off she feels like her right eye is and is seeing as well as the left and feels like the visual fields overlap more than she has noticed.  She denies one-sided numbness or weakness denies difficulty speech or swallowing denies ear pain denies cough congestion or fever.  Denies head or neck injury.  She does have a history of migraines but does not think this feels similar.  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   Dizziness      Home Medications Prior to Admission medications   Medication Sig Start Date End Date Taking? Authorizing Provider  meclizine (ANTIVERT) 25 MG tablet Take 1 tablet (25 mg total) by mouth 3 (three) times daily as needed for dizziness. 01/27/23  Yes Melene Plan, DO  spironolactone (ALDACTONE) 25 MG tablet Take 25 mg by mouth 2 (two) times daily. 01/16/23  Yes [provider]  dexlansoprazole (DEXILANT) 60 MG capsule Take 1 capsule (60 mg total) by mouth daily. 02/11/22   Iva Boop, MD  levothyroxine (SYNTHROID) 112 MCG tablet Take 112 mcg by mouth daily before breakfast.    [provider]  Prenatal Vit-Fe Fumarate-FA (MULTIVITAMIN-PRENATAL) 27-0.8 MG TABS tablet Take 1 tablet by mouth at bedtime.    [provider]  Probiotic Product (PROBIOTIC PO) Take 1 capsule by mouth daily.    [provider]      Allergies    Gluten meal and Imitrex [sumatriptan]    Review of Systems   Review of Systems  Neurological:  Positive for dizziness.     Physical Exam Updated Vital Signs BP 105/66   Pulse 78   Temp 98 F (36.7 C) (Oral)   Resp 16   Ht 5\' 5"  (1.651 m)   Wt 56.7 kg   LMP 01/04/2023   SpO2 97%   Breastfeeding No   BMI 20.80 kg/m  Physical Exam Vitals and nursing note reviewed.  Constitutional:      General: She is not in acute distress.    Appearance: She is well-developed. She is not diaphoretic.  HENT:     Head: Normocephalic and atraumatic.  Eyes:     Pupils: Pupils are equal, round, and reactive to light.  Cardiovascular:     Rate and Rhythm: Normal rate and regular rhythm.     Heart sounds: No murmur heard.    No friction rub. No gallop.  Pulmonary:     Effort: Pulmonary effort is normal.     Breath sounds: No wheezing or rales.  Abdominal:     General: There is no distension.     Palpations: Abdomen is soft.     Tenderness: There is no abdominal tenderness.  Musculoskeletal:        General: No tenderness.     Cervical back: Normal range of motion and neck supple.  Skin:    General: Skin is warm and dry.  Neurological:     Mental  Status: She is alert and oriented to person, place, and time.     GCS: GCS eye subscore is 4. GCS verbal subscore is 5. GCS motor subscore is 6.     Cranial Nerves: Cranial nerves 2-12 are intact.     Sensory: Sensation is intact.     Motor: Motor function is intact.     Coordination: Coordination is intact.     Comments: Patient is able to ambulate briskly to the door were and quickly turned and walked back to the bed without issue.  She is slightly off with finger in the nose but is able to touch my finger each time.  Heel-to-shin without issue.  She has left-sided fast going nystagmus.  Fatigable.  Otherwise benign neurologic exam.  Psychiatric:        Behavior: Behavior normal.     ED Results / Procedures / Treatments   Labs (all labs ordered are listed, but only abnormal results are displayed) Labs Reviewed  CBC WITH DIFFERENTIAL/PLATELET  BASIC  METABOLIC PANEL  MAGNESIUM  PREGNANCY, URINE    EKG None  Radiology No results found.  Procedures Procedures    Medications Ordered in ED Medications  sodium chloride 0.9 % bolus 1,000 mL (1,000 mLs Intravenous New Bag/Given 01/27/23 1325)  diphenhydrAMINE (BENADRYL) injection 25 mg (25 mg Intravenous Given 01/27/23 1332)  prochlorperazine (COMPAZINE) injection 10 mg (10 mg Intravenous Given 01/27/23 1346)  meclizine (ANTIVERT) tablet 25 mg (25 mg Oral Given 01/27/23 1332)  dexamethasone (DECADRON) tablet 10 mg (10 mg Oral Given 01/27/23 1331)    ED Course/ Medical Decision Making/ A&P                             Medical Decision Making Amount and/or Complexity of Data Reviewed Labs: ordered.  Risk Prescription drug management.   32 yo F with a chief complaints of feeling dizzy.  She describes this as an unsteady feeling usually worse with walking and with quick head position changes especially changing from sitting to standing or bending to standing.  Seems to get better with holding still but does not fully go away.  She has some left-sided fast going nystagmus for me on exam.  Otherwise I find no obvious focal neurologic deficit.  She has some eye symptoms as well which is somewhat concerning.  Will obtain laboratory evaluation attempt to treat symptoms and reassess.  Blood work without significant electrolyte abnormality no significant anemia.  The patient unfortunately had experienced what sounds like akathisia and now is a bit sleepy after having Benadryl.  She does feel like her dizziness is gone.  She feeling her vision also is completely corrected.  I wonder if this was a complicated migraine.  I did discuss the possibility of sending her for acute MRI which she is declining at this time.  She has a neurologist that sees her for her headaches will have her follow-up with them in the office.  I encouraged her to return anytime her symptoms worsen especially if she had  difficulty walking or one-sided numbness or weakness or difficulty speech or swallowing.  2:37 PM:  I have discussed the diagnosis/risks/treatment options with the patient and family.  Evaluation and diagnostic testing in the emergency department does not suggest an emergent condition requiring admission or immediate intervention beyond what has been performed at this time.  They will follow up with Neuro. We also discussed returning to the ED immediately if new or  worsening sx occur. We discussed the sx which are most concerning (e.g., sudden worsening pain, fever, inability to tolerate by mouth, inability to ambulate, stroke s/sx) that necessitate immediate return. Medications administered to the patient during their visit and any new prescriptions provided to the patient are listed below.  Medications given during this visit Medications  sodium chloride 0.9 % bolus 1,000 mL (1,000 mLs Intravenous New Bag/Given 01/27/23 1325)  diphenhydrAMINE (BENADRYL) injection 25 mg (25 mg Intravenous Given 01/27/23 1332)  prochlorperazine (COMPAZINE) injection 10 mg (10 mg Intravenous Given 01/27/23 1346)  meclizine (ANTIVERT) tablet 25 mg (25 mg Oral Given 01/27/23 1332)  dexamethasone (DECADRON) tablet 10 mg (10 mg Oral Given 01/27/23 1331)     The patient appears reasonably screen and/or stabilized for discharge and I doubt any other medical condition or other [facility] requiring further screening, evaluation, or treatment in the ED at this time prior to discharge.          Final Clinical Impression(s) / ED Diagnoses Final diagnoses:  Dizziness    Rx / DC Orders ED Discharge Orders          Ordered    meclizine (ANTIVERT) 25 MG tablet  3 times daily PRN        01/27/23 1429              Melene Plan, DO 01/27/23 1437

## 2023-01-28 ENCOUNTER — Emergency Department (HOSPITAL_COMMUNITY)
Admission: EM | Admit: 2023-01-28 | Discharge: 2023-01-28 | Disposition: A | Discharge status: Home / Self Care | Payer: BC Managed Care – PPO | Attending: Emergency Medicine | Admitting: Emergency Medicine

## 2023-01-28 ENCOUNTER — Emergency Department (HOSPITAL_COMMUNITY): Payer: BC Managed Care – PPO

## 2023-01-28 ENCOUNTER — Other Ambulatory Visit: Payer: Self-pay

## 2023-01-28 ENCOUNTER — Encounter (HOSPITAL_COMMUNITY): Payer: Self-pay | Admitting: Emergency Medicine

## 2023-01-28 DIAGNOSIS — M50322 Other cervical disc degeneration at C5-C6 level: Secondary | ICD-10-CM | POA: Diagnosis not present

## 2023-01-28 DIAGNOSIS — M5030 Other cervical disc degeneration, unspecified cervical region: Secondary | ICD-10-CM | POA: Diagnosis not present

## 2023-01-28 DIAGNOSIS — R200 Anesthesia of skin: Secondary | ICD-10-CM | POA: Diagnosis not present

## 2023-01-28 DIAGNOSIS — D72829 Elevated white blood cell count, unspecified: Secondary | ICD-10-CM | POA: Diagnosis not present

## 2023-01-28 DIAGNOSIS — R42 Dizziness and giddiness: Secondary | ICD-10-CM | POA: Diagnosis not present

## 2023-01-28 LAB — CBC WITH DIFFERENTIAL/PLATELET
Abs Immature Granulocytes: 0.04 10*3/uL (ref 0.00–0.07)
Basophils Absolute: 0 10*3/uL (ref 0.0–0.1)
Basophils Relative: 0 %
Eosinophils Absolute: 0 10*3/uL (ref 0.0–0.5)
Eosinophils Relative: 0 %
HCT: 41 % (ref 36.0–46.0)
Hemoglobin: 13.7 g/dL (ref 12.0–15.0)
Immature Granulocytes: 0 %
Lymphocytes Relative: 16 %
Lymphs Abs: 1.9 10*3/uL (ref 0.7–4.0)
MCH: 30.6 pg (ref 26.0–34.0)
MCHC: 33.4 g/dL (ref 30.0–36.0)
MCV: 91.5 fL (ref 80.0–100.0)
Monocytes Absolute: 1.1 10*3/uL — ABNORMAL HIGH (ref 0.1–1.0)
Monocytes Relative: 9 %
Neutro Abs: 9.1 10*3/uL — ABNORMAL HIGH (ref 1.7–7.7)
Neutrophils Relative %: 75 %
Platelets: 251 10*3/uL (ref 150–400)
RBC: 4.48 MIL/uL (ref 3.87–5.11)
RDW: 12.8 % (ref 11.5–15.5)
WBC: 12.2 10*3/uL — ABNORMAL HIGH (ref 4.0–10.5)
nRBC: 0 % (ref 0.0–0.2)

## 2023-01-28 LAB — BASIC METABOLIC PANEL
Anion gap: 8 (ref 5–15)
BUN: 9 mg/dL (ref 6–20)
CO2: 25 mmol/L (ref 22–32)
Calcium: 9.2 mg/dL (ref 8.9–10.3)
Chloride: 109 mmol/L (ref 98–111)
Creatinine, Ser: 0.63 mg/dL (ref 0.44–1.00)
GFR, Estimated: >60 mL/min (ref >60)
Glucose, Bld: 107 mg/dL — ABNORMAL HIGH (ref 70–99)
Potassium: 3.6 mmol/L (ref 3.5–5.1)
Sodium: 142 mmol/L (ref 135–145)

## 2023-01-28 LAB — PREGNANCY, URINE: Preg Test, Ur: NEGATIVE

## 2023-01-28 MED ORDER — PREDNISONE 10 MG PO TABS: 20.0000 mg | ORAL_TABLET | Freq: Every day | ORAL | 0 refills | Status: DC

## 2023-01-28 NOTE — ED Provider Notes (Signed)
[place] EMERGENCY DEPARTMENT AT [facility] Provider Note   CSN: [id number] Arrival date & time: 01/28/23  6213     History  Chief Complaint  Patient presents with   Dizziness    Kristine Taylor is a 32 y.o. female history of celiac, hypothyroidism presented with numbness both arms and legs that began this morning.  Patient was seen yesterday [facility] for double vision and feeling unsteady and was ultimately discharged without an MRI after a reassuring exam.  Patient states that she went home and slept from 2 PM to 5 AM this morning when she woke up and felt that her extremities were numb.  Patient states she feels unsteady when she walks still and that she feels weak in all 4 extremities but most notably in the right arm.  Patient notes that she is decrease sensation to and that this is mostly in the right arm as well.  Patient endorsed dizziness in which she feels unstable. patient does note that the double vision has resolved from yesterday.  Patient did have URI 2 months ago but otherwise has been healthy.  Patient denied chest pain, shortness of breath, LOC, falls, and neck pain, headache, trauma, tinnitus, room spinning  Home Medications Prior to Admission medications   Medication Sig Start Date End Date Taking? Authorizing Provider  predniSONE (DELTASONE) 10 MG tablet Take 2 tablets (20 mg total) by mouth daily. 01/28/23  Yes Yuka Lallier, Beverly Gust, PA-C  dexlansoprazole (DEXILANT) 60 MG capsule Take 1 capsule (60 mg total) by mouth daily. 02/11/22   Iva Boop, MD  levothyroxine (SYNTHROID) 112 MCG tablet Take 112 mcg by mouth daily before breakfast.    [provider]  meclizine (ANTIVERT) 25 MG tablet Take 1 tablet (25 mg total) by mouth 3 (three) times daily as needed for dizziness. 01/27/23   Melene Plan, DO  Prenatal Vit-Fe Fumarate-FA (MULTIVITAMIN-PRENATAL) 27-0.8 MG TABS tablet Take 1 tablet by mouth at bedtime.    [provider]  Probiotic  Product (PROBIOTIC PO) Take 1 capsule by mouth daily.    [provider]  spironolactone (ALDACTONE) 25 MG tablet Take 25 mg by mouth 2 (two) times daily. 01/16/23   [provider]      Allergies    Gluten meal and Imitrex [sumatriptan]    Review of Systems   Review of Systems  Neurological:  Positive for dizziness.    Physical Exam Updated Vital Signs BP 122/69 (BP Location: Right Arm)   Pulse 95   Temp 98 F (36.7 C)   Resp 16   Wt 56.7 kg   LMP 01/04/2023   SpO2 100%   BMI 20.80 kg/m  Physical Exam Vitals reviewed.  Constitutional:      General: She is not in acute distress. HENT:     Head: Normocephalic and atraumatic.  Eyes:     Extraocular Movements: Extraocular movements intact.     Conjunctiva/sclera: Conjunctivae normal.     Pupils: Pupils are equal, round, and reactive to light.  Cardiovascular:     Rate and Rhythm: Normal rate and regular rhythm.     Pulses: Normal pulses.     Heart sounds: Normal heart sounds.     Comments: 2+ bilateral radial/dorsalis pedis pulses with regular rate Pulmonary:     Effort: Pulmonary effort is normal. No respiratory distress.     Breath sounds: Normal breath sounds.  Abdominal:     Palpations: Abdomen is soft.     Tenderness: There is  no abdominal tenderness. There is no guarding or rebound.  Musculoskeletal:        General: Normal range of motion.     Cervical back: Normal range of motion and neck supple.     Comments: 5 out of 5 bilateral grip/leg extension strength  Skin:    General: Skin is warm and dry.     Capillary Refill: Capillary refill takes less than 2 seconds.  Neurological:     Mental Status: She is alert and oriented to person, place, and time.     Coordination: Romberg sign negative. Coordination normal. Heel to Shin Test normal.     Gait: Gait is intact.     Comments: Decreased sensation in right arm as opposed to left Weak grip bilaterally however more notable on the right  side Vision grossly intact Cranial nerves III through XII intact Initially had abnormal finger-to-nose on right hand  Psychiatric:        Mood and Affect: Mood normal.     ED Results / Procedures / Treatments   Labs (all labs ordered are listed, but only abnormal results are displayed) Labs Reviewed  CBC WITH DIFFERENTIAL/PLATELET - Abnormal; Notable for the following components:      Result Value   WBC 12.2 (*)    Neutro Abs 9.1 (*)    Monocytes Absolute 1.1 (*)    All other components within normal limits  BASIC METABOLIC PANEL - Abnormal; Notable for the following components:   Glucose, Bld 107 (*)    All other components within normal limits  PREGNANCY, URINE  CBC WITH DIFFERENTIAL/PLATELET    EKG EKG Interpretation  Date/Time:  Tuesday January 28 2023 07:12:19 EDT Ventricular Rate:  98 PR Interval:  134 QRS Duration: 91 QT Interval:  340 QTC Calculation: 435 R Axis:   85 Text Interpretation: Sinus rhythm Biatrial enlargement Borderline repolarization abnormality Confirmed by Ernie Avena ([id number]) on 01/28/2023 11:06:22 AM  Radiology MR Cervical Spine Wo Contrast  Result Date: 01/28/2023 CLINICAL DATA:  32 year old female with weakness and dizziness on waking today. Vision changes. EXAM: MRI CERVICAL SPINE WITHOUT CONTRAST TECHNIQUE: Multiplanar, multisequence MR imaging of the cervical spine was performed. No intravenous contrast was administered. COMPARISON:  Brain MRI today reported separately. FINDINGS: Alignment: Reversal of the normal cervical lordosis, primarily at the C4-C5 level. No significant scoliosis or spondylolisthesis. Vertebrae: Visualized bone marrow signal is within normal limits. No marrow edema or evidence of acute osseous abnormality. Cord: Normal.  Fairly capacious spinal canal throughout. Posterior Fossa, vertebral arteries, paraspinal tissues: Cervicomedullary junction is within normal limits. Brain is detailed separately today. Preserved major  vascular flow voids in the bilateral neck. Vertebral arteries are fairly codominant. Negative visible neck soft tissues, lung apices. Disc levels: C2-C3:  Negative. C3-C4:  Negative. C4-C5:  Negative. C5-C6: Disc desiccation. But minor if any disc bulging here. No discrete disc herniation. No spinal or foraminal stenosis. C6-C7:  Mild if any disc desiccation and disc bulging.  No stenosis. C7-T1:  Negative. Visible upper thoracic levels are negative. IMPRESSION: 1. Minor cervical disc degeneration, most pronounced at C5-C6. Reversal of the normal cervical lordosis there. But no discrete disc herniation. No spinal or foraminal stenosis. 2. Otherwise negative. Electronically Signed   By: Odessa Fleming M.D.   On: 01/28/2023 09:15   MR BRAIN WO CONTRAST  Result Date: 01/28/2023 CLINICAL DATA:  32 year old female with weakness and dizziness on waking today. Vision changes. EXAM: MRI HEAD WITHOUT CONTRAST TECHNIQUE: Multiplanar, multiecho pulse sequences of the brain  and surrounding structures were obtained without intravenous contrast. COMPARISON:  Cervical spine MRI today reported separately. Head CT 04/05/2020. FINDINGS: Brain: Normal cerebral volume. No restricted diffusion to suggest acute infarction. No midline shift, mass effect, evidence of mass lesion, ventriculomegaly, extra-axial collection or acute intracranial hemorrhage. Cervicomedullary junction and pituitary are within normal limits. Wallace Cullens and white matter signal is within normal limits throughout the brain. No encephalomalacia or chronic cerebral blood products identified. Vascular: Major intracranial vascular flow voids are preserved. Skull and upper cervical spine: Cervical spine detailed separately. Visualized bone marrow signal is within normal limits. Sinuses/Orbits: Normal suprasellar cistern. Unremarkable optic chiasm. Orbits soft tissues appear symmetric and negative. Paranasal sinuses and mastoids are clear. Other: Grossly normal visible internal  auditory structures. Negative visible scalp and face. IMPRESSION: Normal noncontrast MRI appearance of the Brain. Electronically Signed   By: Odessa Fleming M.D.   On: 01/28/2023 09:12    Procedures Procedures    Medications Ordered in ED Medications - No data to display  ED Course/ Medical Decision Making/ A&P                             Medical Decision Making Amount and/or Complexity of Data Reviewed Labs: ordered. Radiology: ordered.  Risk Prescription drug management.   Kandis Mannan Maudlin 32 y.o. presented today for dizziness, decrease sensation/strength. Working DDx that I considered at this time includes, but not limited to, DDD, CVA/TIA, MS, intracranial mass, ICP, electrolyte abnormalities.  R/o DDx: CVA/TIA, MS, intracranial mass, ICP, electrolyte abnormalities: These are considered less likely due to history of present illness and physical exam findings  Review of prior external notes: 01/27/2023 ED provider  Unique Tests and My Interpretation:  BMP: Unremarkable CBC: Unremarkable, slightly elevated white blood cell count however patient did receive Decadron yesterday Urine pregnancy: Negative MRI brain without contrast: No acute changes MRI cervical spine without contrast: Mild cervical disc degeneration at C5-C6  Discussion with Independent Historian: None  Discussion of Management of Tests:  None  Risk: Low: based on diagnostic testing/clinical impression and treatment plan  Risk Stratification Score: None  Staffed with Karene Fry, MD  Plan: Patient presented for dizziness, decrease sensation/strength. On exam patient was in no acute distress and had stable vitals.  On exam patient did have decreased grip and sensation in her right hand as opposed to the left side.  Patient's finger-nose was initially abnormal with her right hand as well.  Patient was seen yesterday and after speaking with the provider declined the MRI however now patient is experiencing  paresthesias and weakness and so after speaking with the patient we decided to proceed with an MRI to fully assess patient for stroke or other neurologic disease.  MRI showed that patient has mild disc degeneration in C5-C6 which could possibly explain her symptoms.  Patient does see neurology already.  Rest patient's labs are reassuring.  Patient is that she is comfortable being discharged and following up with her neurologist.  Patient will be given prednisone and encouraged to take anti-inflammatories over-the-counter in the meantime.  Patient was given return precautions. Patient stable for discharge at this time.  Patient verbalized understanding of plan.         Final Clinical Impression(s) / ED Diagnoses Final diagnoses:  Other cervical disc degeneration at C5-C6 level    Rx / DC Orders ED Discharge Orders          Ordered    predniSONE (DELTASONE) 10  MG tablet  Daily        01/28/23 0957              Remi Deter 01/28/23 1118    Ernie Avena, MD 01/28/23 1149

## 2023-01-28 NOTE — ED Notes (Signed)
Lab is calling about pt

## 2023-01-28 NOTE — ED Triage Notes (Signed)
Pt presents from home for vertigo & blurred vision starting yesterday and new R arm and R leg numbness that she woke up with. LKW 10pm.   Was seen for same at [facility] ER and treated for migraine.   Denies HA or neck pain.

## 2023-01-28 NOTE — Discharge Instructions (Addendum)
Please follow-up with your neurologist regarding recent symptoms and ER visit.  Today your MRI showed that you have mild disc degeneration at your C5-C6 region which could possibly explain your symptoms.  You may take Tylenol or ibuprofen every 6 hours needed for pain and use the steroids I have prescribed you to help with inflammation.  If symptoms worsen or change please return to ER.
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
## 2023-02-04 ENCOUNTER — Encounter: Payer: Self-pay | Admitting: Neurology

## 2023-02-04 ENCOUNTER — Ambulatory Visit: Payer: BC Managed Care – PPO | Admitting: Neurology

## 2023-02-04 VITALS — BP 95/57 | HR 73 | Ht 64.0 in | Wt 120.0 lb

## 2023-02-04 DIAGNOSIS — R42 Dizziness and giddiness: Secondary | ICD-10-CM

## 2023-02-04 NOTE — Patient Instructions (Signed)
May be inner ear vertigo and the numbness was a reaction to the medication, or these symptoms together may be migraine.  Refer to physical therapy for vestibular rehab. If ineffective, contact me and we can discuss starting medication for migraine.

## 2023-02-10 ENCOUNTER — Encounter: Payer: Self-pay | Admitting: Physical Therapy

## 2023-02-10 ENCOUNTER — Ambulatory Visit: Payer: BC Managed Care – PPO | Attending: Neurology | Admitting: Physical Therapy

## 2023-02-10 DIAGNOSIS — R42 Dizziness and giddiness: Secondary | ICD-10-CM | POA: Diagnosis not present

## 2023-02-10 DIAGNOSIS — R2681 Unsteadiness on feet: Secondary | ICD-10-CM | POA: Insufficient documentation

## 2023-02-10 NOTE — Patient Instructions (Signed)
     Gaze Stabilization: Tip Card  1.Target must remain in focus, not blurry, and appear stationary while head is in motion. 2.Perform exercises with small head movements (45 to either side of midline). 3.Increase speed of head motion so long as target is in focus. 4.If you wear eyeglasses, be sure you can see target through lens (therapist will give specific instructions for bifocal / progressive lenses). 5.These exercises may provoke dizziness or nausea. Work through these symptoms. If too dizzy, slow head movement slightly. Rest between each exercise. 6.Exercises demand concentration; avoid distractions. 7.For safety, perform standing exercises close to a counter, wall, corner, or next to someone.   Gaze Stabilization: Standing Feet Apart    Feet shoulder width apart, keeping eyes on target on wall _5-6___ feet away, tilt head down 15-30 and move head side to side for __60__ seconds. Repeat while moving head up and down for __60__ seconds. Do __3-5_ sessions per day. Repeat using target on pattern background.  (WAIT)  Copyright  VHI. All rights reserved.

## 2023-02-10 NOTE — Therapy (Signed)
OUTPATIENT PHYSICAL THERAPY VESTIBULAR EVALUATION     Patient Name: Kristine Taylor MRN: [id number] DOB:07-11-1991, 32 y.o., female Today's Date: 02/11/2023  END OF SESSION:  PT End of Session - 02/11/23 1314     Visit Number 1    Number of Visits 5    Date for PT Re-Evaluation 03/21/23   pushed out to schedule availability and pt's preferred times   Authorization Type BCBS    PT Start Time 0801    PT Stop Time 0850    PT Time Calculation (min) 49 min    Activity Tolerance Patient tolerated treatment well    Behavior During Therapy [facility] for tasks assessed/performed             Past Medical History:  Diagnosis Date   Arthritis    Asthma    Celiac disease?    Permissive genes not present on [facility] testing   Chronic kidney disease    Esophageal dysmotility    GERD (gastroesophageal reflux disease)    Hashimoto's disease    History of stomach ulcers    Hypothyroidism    Lymphocytic hypophysitis (HCC)    Shingles    Vaginal Pap smear, abnormal    Past Surgical History:  Procedure Laterality Date   ESOPHAGEAL MANOMETRY  2017   ESOPHAGOGASTRODUODENOSCOPY  12/2015   LEEP  2018   NASAL SEPTUM SURGERY     NOSE REPAIR  2006   [facility] IMPEDANCE STUDY  2017   Patient Active Problem List   Diagnosis Date Noted   Encounter for induction of labor 08/27/2021   SVD 1/9 08/27/2021   Postpartum care following vaginal delivery 1/9 08/27/2021   Bladder dysfunction 08/27/2021   Agoraphobia 06/23/2020   Celiac disease 09/04/2018   Acquired hypothyroidism 09/04/2018   Arthritis 09/04/2018    PCP: Deeann Saint, MD REFERRING PROVIDER: Drema Dallas, DO  REFERRING DIAG:  R42 (ICD-10-CM) - Vertigo    THERAPY DIAG:  Dizziness and giddiness  Unsteadiness on feet  ONSET DATE: 01-27-23  Rationale for Evaluation and Treatment: Rehabilitation  SUBJECTIVE:   SUBJECTIVE STATEMENT: Pt went to ED on 01-27-23 and then again on 01-29-23; pt reports she feels like her  eyes are not level and feels like she is moving, as if she is on a boat; pt is able to drive - doesn't perceive things in environment as moving; states she has been doing visual tasks, I.e. phone less due to feeling like eyes are strained  Per chart note 02-04-23 from Dr. Everlena Cooper-  "Woke up on 6/10 feeling dizzy.  Reports sensation of movement while still but not a spinning.  She noted double vision and felt difficulty focusing her eyes.  She was seen in the ED.  She declined MRI of brain.  Treated for presumed migraine with headache cocktail and meclizine.  The medication caused extreme lethargy and she went home to sleep for 19 hours.  The next morning she woke up and felt numbness and paresthesias in both hands from elbows to fingers and both legs from knees down to toes"  Pt accompanied by: self  PERTINENT HISTORY: hypothyroidism and diabetes insipidus  PAIN:  Are you having pain? No  PRECAUTIONS: None  WEIGHT BEARING RESTRICTIONS: No  FALLS: Has patient fallen in last 6 months? No  LIVING ENVIRONMENT: Lives with: lives with their family Lives in: House/apartment  PLOF: Independent  PATIENT GOALS: improve vision, reduce dizziness, feel normal again  OBJECTIVE:   DIAGNOSTIC FINDINGS: Normal noncontrast MRI appearance  of the Brain. IMPRESSION: 1. Minor cervical disc degeneration, most pronounced at C5-C6. Reversal of the normal cervical lordosis there. But no discrete disc herniation. No spinal or foraminal stenosis. 2. Otherwise negative.    COGNITION: Overall cognitive status: Within functional limits for tasks assessed   Cervical ROM:  WNL's    GAIT: Gait pattern: WFL   FUNCTIONAL TESTS:  MCTSIB: Condition 1: Avg of 3 trials: 30 sec, Condition 2: Avg of 3 trials: 30 sec, Condition 3: Avg of 3 trials: 30 sec, Condition 4: Avg of 3 trials: 30 sec, and Total Score: 120/120  PATIENT SURVEYS:  FOTO DPS 50; risk adjusted score 51/100; DFS 58.2  VESTIBULAR  ASSESSMENT:  GENERAL OBSERVATION: Pt is a 32 yr old lady with c/o dizziness/visual disturbance that started 2 weeks ago when she woke up   SYMPTOM BEHAVIOR:  Subjective history: Pt reports she feels more off balance - states she woke up with dizziness 2 weeks ago today; couldn't focus eyes - not double but almost cross-eyed; rates intensity of dizziness 5/10 at this time.  Pt reports having 5 migraines in past 3.5 yrs  Non-Vestibular symptoms: changes in vision, migraine symptoms, and pressure feeling in head - has it currently  Type of dizziness: Imbalance (Disequilibrium)  Frequency: daily  Duration: constant  Aggravating factors: Induced by motion: occur when walking and moving eyes, Occurs when standing still , and Moving eyes  Relieving factors: no known relieving factors  Progression of symptoms: better  OCULOMOTOR EXAM:  Ocular Alignment: normal  Ocular ROM: No Limitations  Spontaneous Nystagmus: absent  Gaze-Induced Nystagmus:  absent  Smooth Pursuits: intact - pt reported this testing provoked nausea   Saccades: intact  VESTIBULAR - OCULAR REFLEX:     Head-Impulse Test: HIT Right: negative HIT Left: negative  Dynamic Visual Acuity: Static: line 11 Dynamic: line 6 (abnormal as a 5 line difference)     VESTIBULAR TREATMENT:                                                                                                   DATE: 02-10-23  Gaze Adaptation:  x1 Viewing Horizontal: Position: standing, Time: 30 secs, Reps: 1, and Comment: mild symptoms provoked and x1 Viewing Vertical:  Position: standing, Time: 30 secs, and Reps: 1  PATIENT EDUCATION: Education details: gaze stabilization Person educated: Patient Education method: Explanation, Demonstration, and Handouts Education comprehension: verbalized understanding and returned demonstration  HOME EXERCISE PROGRAM: x1 viewing in standing  GOALS: Goals reviewed with patient? Yes   LONG TERM GOALS: Target date:  03-21-23 (pushed out due to schedule availability)  Pt will improve DVA to a </= 2 line difference for improved gaze stabilization.  Baseline: 5 line difference; line 11/line 6 Goal status: INITIAL  2.  Pt will subjectively report improvement with visual scanning activities such as texting and emailing on phone and reading.  Baseline:  Goal status: INITIAL  3.  Improve FOTO DPS score to >/= 60 to demo improvement in dizziness.  Baseline: DPS 50;  DFS 58.2 Goal status: INITIAL  4.  Independent in HEP for vestibular  exercises.  Baseline:  Goal status: INITIAL  5.  Complete SOT and establish goal as appropriate.   Baseline:  Goal status: INITIAL   ASSESSMENT:  CLINICAL IMPRESSION: Patient is a 32 y.o. 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  Pt's DVA abnormal with a 5 line difference. Pt reported provocation of dizziness with oculomotor testing of smooth pursuits and saccades, but no nystagmus noted.   Pt able to stand on all 4 conditions of mCTSIB for 30 secs without LOB with mild postural sway on condition 4, demonstrating good vestibular input in maintaining balance. Pt will benefit from PT to address vestibular and oculomotor deficits.     OBJECTIVE IMPAIRMENTS: decreased balance, dizziness, and impaired vision/preception.   ACTIVITY LIMITATIONS: bending and locomotion level  PARTICIPATION LIMITATIONS: cleaning, interpersonal relationship, shopping, and community activity  PERSONAL FACTORS: 1 comorbidity: vestibular migraine  are also affecting patient's functional outcome.   REHAB POTENTIAL: Good  CLINICAL DECISION MAKING: Stable/uncomplicated  EVALUATION COMPLEXITY: Low   PLAN:  PT FREQUENCY: 1x/week  PT DURATION: 4 weeks  PLANNED INTERVENTIONS: Therapeutic exercises, Therapeutic activity, Neuromuscular re-education, Balance training, Gait training,  Patient/Family education, Self Care, and Vestibular training  PLAN FOR NEXT SESSION: do SOT; check gaze stabilization exercise; do DHI   Fintan Grater, Donavan Burnet, PT 02/11/2023, 3:17 PM

## 2023-02-25 ENCOUNTER — Ambulatory Visit: Payer: BC Managed Care – PPO | Attending: Neurology | Admitting: Physical Therapy

## 2023-02-25 DIAGNOSIS — R42 Dizziness and giddiness: Secondary | ICD-10-CM | POA: Diagnosis not present

## 2023-02-25 DIAGNOSIS — R2681 Unsteadiness on feet: Secondary | ICD-10-CM | POA: Diagnosis not present

## 2023-02-25 NOTE — Therapy (Signed)
OUTPATIENT PHYSICAL THERAPY VESTIBULAR TREATMENT NOTE     Patient Name: Kristine Taylor MRN: [id number] DOB:1991/04/23, 32 y.o., female Today's Date: 02/26/2023  END OF SESSION:  PT End of Session - 02/26/23 1410     Visit Number 2    Number of Visits 5    Date for PT Re-Evaluation 03/21/23   pushed out to schedule availability and pt's preferred times   Authorization Type BCBS    PT Start Time 0800    PT Stop Time 0844    PT Time Calculation (min) 44 min    Equipment Utilized During Treatment Other (comment)   harness vest used with SOT on balance master   Activity Tolerance Patient tolerated treatment well    Behavior During Therapy WFL for tasks assessed/performed              Past Medical History:  Diagnosis Date   Arthritis    Asthma    Celiac disease?    Permissive genes not present on [facility] testing   Chronic kidney disease    Esophageal dysmotility    GERD (gastroesophageal reflux disease)    Hashimoto's disease    History of stomach ulcers    Hypothyroidism    Lymphocytic hypophysitis (HCC)    Shingles    Vaginal Pap smear, abnormal    Past Surgical History:  Procedure Laterality Date   ESOPHAGEAL MANOMETRY  2017   ESOPHAGOGASTRODUODENOSCOPY  12/2015   LEEP  2018   NASAL SEPTUM SURGERY     NOSE REPAIR  2006   [facility] IMPEDANCE STUDY  2017   Patient Active Problem List   Diagnosis Date Noted   Encounter for induction of labor 08/27/2021   SVD 1/9 08/27/2021   Postpartum care following vaginal delivery 1/9 08/27/2021   Bladder dysfunction 08/27/2021   Agoraphobia 06/23/2020   Celiac disease 09/04/2018   Acquired hypothyroidism 09/04/2018   Arthritis 09/04/2018    PCP: Deeann Saint, MD REFERRING PROVIDER: Drema Dallas, DO  REFERRING DIAG:  R42 (ICD-10-CM) - Vertigo    THERAPY DIAG:  Dizziness and giddiness  Unsteadiness on feet  ONSET DATE: 01-27-23  Rationale for Evaluation and Treatment: Rehabilitation  SUBJECTIVE:    SUBJECTIVE STATEMENT: Pt reports she has been doing the eye exercise as instructed at least 3 times/day, but is unable to do exercise longer than approx. 40 secs due to symptoms provoked;  pt reports she gets more nauseous with the horizontal head turns but the A gets "squiggly" with the vertical head turns after approx. 20 secs  Pt accompanied by: self  PERTINENT HISTORY: hypothyroidism and diabetes insipidus  PAIN:  Are you having pain? No  PRECAUTIONS: None  WEIGHT BEARING RESTRICTIONS: No  FALLS: Has patient fallen in last 6 months? No  LIVING ENVIRONMENT: Lives with: lives with their family Lives in: House/apartment  PLOF: Independent  PATIENT GOALS: improve vision, reduce dizziness, feel normal again  OBJECTIVE:   DIAGNOSTIC FINDINGS: Normal noncontrast MRI appearance of the Brain. IMPRESSION: 1. Minor cervical disc degeneration, most pronounced at C5-C6. Reversal of the normal cervical lordosis there. But no discrete disc herniation. No spinal or foraminal stenosis. 2. Otherwise negative.   VESTIBULAR TREATMENT:  DATE: 02-25-23  Gaze Adaptation:  x1 Viewing Horizontal: Position: standing, Time: 30 secs, Reps: 1, and Comment: mild symptoms provoked and x1 Viewing Vertical:  Position: standing, Time: 30 secs, and Reps: 1 Tried horizontal head turns with smaller ROM and with increased speed but pt reported more provocation of symptoms so stopped exercise in this manner and returned to pt's way of performing exercise - with slower speed - plain background used   Standing Balance: Surface: Pillows Position: Narrow Base of Support Feet Hip Width Apart Completed with: Eyes Open and Eyes Closed; Head Turns x 5 Reps and Head Nods x 5 Reps  Gave this exercise for HEP  - feet together with EO and EC with head turns    Feet Together (Compliant Surface) Head Motion - Eyes OPEN  and then Closed           Stand on compliant surface: _pillow_______ with feet together. Close eyes and move head slowly, up and down. Repeat _5 head turns each direction__ per session. Do __1-2__ sessions per day.   Sensory Organization Test: Composite score 70/100 (N=70/100) - WNL's  Somatosensory WNL's Visual input decreased at 68/100 with N= 74/100 Vestibular input WNL's  Condition 1 - all 3 trials WNL's Condition 2 - all 3 trials WNL's Condition 3 - all 3 trials WNL's Condition 4 - all 3 trials decreased slightly from N Condition 5 - trial 1 decreased slightly; trials 2 & 3 WNL's Condition 6 - all 3 trials WNL's     PATIENT EDUCATION: Education details: standing on pillow with feet together with EO and EC with head turns Person educated: Patient Education method: Explanation, Demonstration, and Handouts Education comprehension: verbalized understanding and returned demonstration  HOME EXERCISE PROGRAM: x1 viewing in standing  GOALS: Goals reviewed with patient? Yes   LONG TERM GOALS: Target date: 03-21-23 (pushed out due to schedule availability)  Pt will improve DVA to a </= 2 line difference for improved gaze stabilization.  Baseline: 5 line difference; line 11/line 6 Goal status: INITIAL  2.  Pt will subjectively report improvement with visual scanning activities such as texting and emailing on phone and reading.  Baseline:  Goal status: INITIAL  3.  Improve FOTO DPS score to >/= 60 to demo improvement in dizziness.  Baseline: DPS 50;  DFS 58.2 Goal status: INITIAL  4.  Independent in HEP for vestibular exercises.  Baseline:  Goal status: INITIAL  5.  Complete SOT and establish goal as appropriate.   Baseline:  Goal status: INITIAL   ASSESSMENT:  CLINICAL IMPRESSION: PT session focused on assessment of vestibular input in maintaining balance with pt scoring WNL's for composite score on SOT (70/100).  Somatosensory and vestibular inputs WNL's with  visual input decreased at 68/100 with N= 74/100.  Pt's DVA improved in today's session with a 4 line difference (static visual acuity line 11/dynamic line 7) - improved from 5 line difference at initial eval 2 weeks ago.  Pt is progressing well towards LTG's.   OBJECTIVE IMPAIRMENTS: decreased balance, dizziness, and impaired vision/preception.   ACTIVITY LIMITATIONS: bending and locomotion level  PARTICIPATION LIMITATIONS: cleaning, interpersonal relationship, shopping, and community activity  PERSONAL FACTORS: 1 comorbidity: vestibular migraine  are also affecting patient's functional outcome.   REHAB POTENTIAL: Good  CLINICAL DECISION MAKING: Stable/uncomplicated  EVALUATION COMPLEXITY: Low   PLAN:  PT FREQUENCY: 1x/week  PT DURATION: 4 weeks  PLANNED INTERVENTIONS: Therapeutic exercises, Therapeutic activity, Neuromuscular re-education, Balance training, Gait training, Patient/Family education, Self Care, and Vestibular  training  PLAN FOR NEXT SESSION: calculate DHI; continue with eye exercises - making circles with ball - stand on rockerboard and [employer]   Sabryn Preslar, Donavan Burnet, PT 02/26/2023, 2:12 PM

## 2023-02-26 ENCOUNTER — Encounter: Payer: Self-pay | Admitting: Physical Therapy

## 2023-02-26 NOTE — Patient Instructions (Signed)
Feet Together (Compliant Surface) Head Motion - Eyes Closed    Stand on compliant surface: _pillow_______ with feet together. Close eyes and move head slowly, up and down. Repeat __2__ times per session. Do __1-2__ sessions per day.  Copyright  VHI. All rights reserved.

## 2023-03-04 ENCOUNTER — Ambulatory Visit: Payer: BC Managed Care – PPO | Admitting: Physical Therapy

## 2023-03-04 DIAGNOSIS — R2681 Unsteadiness on feet: Secondary | ICD-10-CM | POA: Diagnosis not present

## 2023-03-04 DIAGNOSIS — R42 Dizziness and giddiness: Secondary | ICD-10-CM

## 2023-03-04 NOTE — Patient Instructions (Addendum)
Oculomotor: Saccades    Holding two targets positioned side by side  12-18__ inches apart, move eyes quickly from target to target as head stays still. Move __10__ seconds each direction. Perform sitting. Repeat ___1_ times per session. Do __2__ sessions per day.   Oculomotor: Smooth Pursuits    Holding a target, keep eyes on target and slowly move target side to side with head still. Perform sitting. Repeat __2__ times per session. Do __2__ sessions per day. Repeat using target on pattern background.

## 2023-03-04 NOTE — Therapy (Unsigned)
OUTPATIENT PHYSICAL THERAPY VESTIBULAR TREATMENT NOTE     Patient Name: Kristine Taylor MRN: [id number] DOB:Dec 15, 1990, 32 y.o., female Today's Date: 03/05/2023  END OF SESSION:  PT End of Session - 03/05/23 1019     Visit Number 3    Number of Visits 5    Date for PT Re-Evaluation 03/21/23   pushed out to schedule availability and pt's preferred times   Authorization Type [employer]    PT Start Time 0803    PT Stop Time 0845    PT Time Calculation (min) 42 min    Equipment Utilized During Treatment --   harness vest used with SOT on balance master   Activity Tolerance Patient tolerated treatment well    Behavior During Therapy WFL for tasks assessed/performed               Past Medical History:  Diagnosis Date   Arthritis    Asthma    Celiac disease?    Permissive genes not present on [facility] testing   Chronic kidney disease    Esophageal dysmotility    GERD (gastroesophageal reflux disease)    Hashimoto's disease    History of stomach ulcers    Hypothyroidism    Lymphocytic hypophysitis (HCC)    Shingles    Vaginal Pap smear, abnormal    Past Surgical History:  Procedure Laterality Date   ESOPHAGEAL MANOMETRY  2017   ESOPHAGOGASTRODUODENOSCOPY  12/2015   LEEP  2018   NASAL SEPTUM SURGERY     NOSE REPAIR  2006   [facility] IMPEDANCE STUDY  2017   Patient Active Problem List   Diagnosis Date Noted   Encounter for induction of labor 08/27/2021   SVD 1/9 08/27/2021   Postpartum care following vaginal delivery 1/9 08/27/2021   Bladder dysfunction 08/27/2021   Agoraphobia 06/23/2020   Celiac disease 09/04/2018   Acquired hypothyroidism 09/04/2018   Arthritis 09/04/2018    PCP: Deeann Saint, MD REFERRING PROVIDER: Drema Dallas, DO  REFERRING DIAG:  R42 (ICD-10-CM) - Vertigo    THERAPY DIAG:  Dizziness and giddiness  ONSET DATE: 01-27-23  Rationale for Evaluation and Treatment: Rehabilitation  SUBJECTIVE:   SUBJECTIVE STATEMENT: Pt reports  she had a setback on Sunday - was playing with her kids in the pool; states she picked her child up and did a spin around in the pool that immediately provoked vertigo.  Has not done exercises since this incident but had done them Sunday morning and felt she was doing better.  Says driving doesn't really bother her;  pt reports " head feels unbalanced"; pt states she feels more dizziness/dysequilibrium since incident in the pool on Sunday. Pt reports she feels her Rt eye is straining to focus - doesn't feel like her Lt eye when she looks at a target.  Forgot to bring [facility] but did complete it - will bring to next appt.  Pt accompanied by: self  PERTINENT HISTORY: hypothyroidism and diabetes insipidus  PAIN:  Are you having pain? No  PRECAUTIONS: None  WEIGHT BEARING RESTRICTIONS: No  FALLS: Has patient fallen in last 6 months? No  LIVING ENVIRONMENT: Lives with: lives with their family Lives in: House/apartment  PLOF: Independent  PATIENT GOALS: improve vision, reduce dizziness, feel normal again  OBJECTIVE:   DIAGNOSTIC FINDINGS: Normal noncontrast MRI appearance of the Brain. IMPRESSION: 1. Minor cervical disc degeneration, most pronounced at C5-C6. Reversal of the normal cervical lordosis there. But no discrete disc herniation. No spinal or foraminal  stenosis. 2. Otherwise negative.   VESTIBULAR TREATMENT:                                                                                                   DATE: 03-04-23  Self care:  discussed habituation concept and not to avoid activity but to scale it down to less movement to increase tolerance for less provocation of symptoms  Gaze Adaptation:  x1 Viewing Horizontal: Position: standing, Time: 40 secs, Reps: 1, and Comment: mild symptoms provoked and x1 Viewing Vertical:  Position: standing, Time: 60 secs, and Reps: 1 Was attempting to perform exercise for 60 secs horizontally but pt reported target was moving and out of focus,  so stopped at 40 secs;  pt reported less acuity in Rt eye with head turn to Lt side, compared to acuity/focus from Lt eye with head turn to Rt side;    Pt reported horizontal x1 viewing more challenging than vertical x1 viewing;  pt also reports she had been able to perform exercise for 60 secs prior to onset of dizziness during incident on Sunday  HIT - Lt HIT - 1-2 beats nystagmus noted on 1st test; performed 2nd rep and no nystagmus noted;  Rt HIT (-) in today's session with no nystagmus noted; Pt reported symptoms provoked with this testing (both Rt and Lt HIT provoked symptoms)  Pt performed in standing - step & pivot turn to Lt/Rt side 3 times each (90 degree turn to each side) - pt reported significant increase in symptoms upon completion of this activity  Pt performed seated smooth pursuits horizontal and vertical approx. 10 reps Saccades - horizontal, vertical, and each  diagonal "X" approx. 10 reps each direction In seated position - pt looked at target "B" on yellow sticky note - moved letter in clockwise direction approx. 5 reps and then in counterclockwise direction 5 reps for improved gaze stabilization Performed VOR cancellation in seated position 3 reps - pt continues to report feeling that it is a strain to focus on target with her Rt eye  PATIENT EDUCATION: Education details: Added smooth pursuits and saccades to HEP; informed pt that she could stand on pillow and use a ball to perform CW and CCW circles with ball for this exercise; educated pt in concept of habituation Person educated: Patient Education method: Programmer, multimedia, Demonstration, and Handouts Education comprehension: verbalized understanding and returned demonstration  HOME EXERCISE PROGRAM: saccades and smooth pursuits added on 03-04-23  Oculomotor: Saccades    Holding two targets positioned side by side  12-18__ inches apart, move eyes quickly from target to target as head stays still. Move __10__ seconds each  direction. Perform sitting. Repeat ___1_ times per session. Do __2__ sessions per day.   Oculomotor: Smooth Pursuits    Holding a target, keep eyes on target and slowly move target side to side with head still. Perform sitting. Repeat __2__ times per session. Do __2__ sessions per day. Repeat using target on pattern background.   GOALS: Goals reviewed with patient? Yes   LONG TERM GOALS: Target date: 03-21-23 (pushed out due to schedule availability)  Pt will improve DVA to a </= 2 line difference for improved gaze stabilization.  Baseline: 5 line difference; line 11/line 6 Goal status: INITIAL  2.  Pt will subjectively report improvement with visual scanning activities such as texting and emailing on phone and reading.  Baseline:  Goal status: INITIAL  3.  Improve FOTO DPS score to >/= 60 to demo improvement in dizziness.  Baseline: DPS 50;  DFS 58.2 Goal status: INITIAL  4.  Independent in HEP for vestibular exercises.  Baseline:  Goal status: INITIAL  5.  Complete SOT and establish goal as appropriate.   Baseline:  Goal status: INITIAL   ASSESSMENT:  CLINICAL IMPRESSION: PT session focused on oculomotor assessment with vestibular assessment as pt appears to have some motion sensitivity after incident of performing quick turn in pool on Sunday.  Pt's time for performing x1 viewing has decreased from 60 secs to 40 secs for horizontal x1 viewing; vertical x1 viewing remains at 60 secs. Pt reports feeling a "strain" in her Rt eye with focusing; unable to perform smooth pursuit horizontally to Rt side past approx. 45 degrees of Rt cervical rotation resulting in lack of focus of target.  Pt describes possible oscillopsia with report of feeling that object (corner of bed) was moving in her peripheral vision when she was trying to read her book.  Pt reports movement in her peripheral visual field, rather than with looking straight ahead and perceiving objects moving.  Etiology of  symptoms is unknown at this time.  Will continue to monitor and assess.  Pt may benefit from ophthalmology consult.    OBJECTIVE IMPAIRMENTS: decreased balance, dizziness, and impaired vision/preception.   ACTIVITY LIMITATIONS: bending and locomotion level  PARTICIPATION LIMITATIONS: cleaning, interpersonal relationship, shopping, and community activity  PERSONAL FACTORS: 1 comorbidity: vestibular migraine  are also affecting patient's functional outcome.   REHAB POTENTIAL: Good  CLINICAL DECISION MAKING: Stable/uncomplicated  EVALUATION COMPLEXITY: Low   PLAN:  PT FREQUENCY: 1x/week  PT DURATION: 4 weeks  PLANNED INTERVENTIONS: Therapeutic exercises, Therapeutic activity, Neuromuscular re-education, Balance training, Gait training, Patient/Family education, Self Care, and Vestibular training  PLAN FOR NEXT SESSION: calculate DHI; continue with eye exercises - making circles with ball - stand on rockerboard and Airex; back to 60 secs with x1 viewing?   Kary Kos, PT 03/05/2023, 11:02 AM

## 2023-03-05 ENCOUNTER — Encounter: Payer: Self-pay | Admitting: Physical Therapy

## 2023-03-15 DIAGNOSIS — J020 Streptococcal pharyngitis: Secondary | ICD-10-CM | POA: Diagnosis not present

## 2023-03-15 DIAGNOSIS — J029 Acute pharyngitis, unspecified: Secondary | ICD-10-CM | POA: Diagnosis not present

## 2023-03-18 ENCOUNTER — Ambulatory Visit: Payer: BC Managed Care – PPO | Admitting: Physical Therapy

## 2023-03-25 ENCOUNTER — Ambulatory Visit: Payer: BC Managed Care – PPO | Attending: Neurology | Admitting: Physical Therapy

## 2023-03-25 DIAGNOSIS — R2681 Unsteadiness on feet: Secondary | ICD-10-CM | POA: Diagnosis not present

## 2023-03-25 DIAGNOSIS — R42 Dizziness and giddiness: Secondary | ICD-10-CM | POA: Insufficient documentation

## 2023-03-25 NOTE — Patient Instructions (Signed)
WALKING MAKING CIRCLES WITH BALL - CLOCKWISE AND COUNTERCLOCKWISE - "S"  Walking tossing ball on Rt and Lt sides -  Walking and tracking letter - eyes moving opposite letter - head not moving   Walking - VOR cancellation - eyes and head moving in same direction as letter - side to side

## 2023-03-25 NOTE — Therapy (Unsigned)
OUTPATIENT PHYSICAL THERAPY VESTIBULAR TREATMENT NOTE     Patient Name: Kristine Taylor MRN: [id number] DOB:10-Dec-1990, 32 y.o., female Today's Date: 03/26/2023  END OF SESSION:  PT End of Session - 03/26/23 1049     Visit Number 4    Number of Visits 5    Date for PT Re-Evaluation 03/21/23   pushed out to schedule availability and pt's preferred times   Authorization Type [employer]    PT Start Time 0800    PT Stop Time 0846    PT Time Calculation (min) 46 min    Equipment Utilized During Treatment --   harness vest used with SOT on balance master   Activity Tolerance Patient tolerated treatment well    Behavior During Therapy WFL for tasks assessed/performed                Past Medical History:  Diagnosis Date   Arthritis    Asthma    Celiac disease?    Permissive genes not present on [facility] testing   Chronic kidney disease    Esophageal dysmotility    GERD (gastroesophageal reflux disease)    Hashimoto's disease    History of stomach ulcers    Hypothyroidism    Lymphocytic hypophysitis (HCC)    Shingles    Vaginal Pap smear, abnormal    Past Surgical History:  Procedure Laterality Date   ESOPHAGEAL MANOMETRY  2017   ESOPHAGOGASTRODUODENOSCOPY  12/2015   LEEP  2018   NASAL SEPTUM SURGERY     NOSE REPAIR  2006   [facility] IMPEDANCE STUDY  2017   Patient Active Problem List   Diagnosis Date Noted   Encounter for induction of labor 08/27/2021   SVD 1/9 08/27/2021   Postpartum care following vaginal delivery 1/9 08/27/2021   Bladder dysfunction 08/27/2021   Agoraphobia 06/23/2020   Celiac disease 09/04/2018   Acquired hypothyroidism 09/04/2018   Arthritis 09/04/2018    PCP: Deeann Saint, MD REFERRING PROVIDER: Drema Dallas, DO  REFERRING DIAG:  R42 (ICD-10-CM) - Vertigo    THERAPY DIAG:  Dizziness and giddiness  Unsteadiness on feet  ONSET DATE: 01-27-23  Rationale for Evaluation and Treatment: Rehabilitation  SUBJECTIVE:   SUBJECTIVE  STATEMENT: Pt reports she had to cancel last week's PT appt due to having strep throat; pt reports she got to feeling better at end of the week of which she had had the increased dizziness after playing with her kids in the pool and doing the spins with them; pt states she is now back to where she was before this incident occurred. Pt has completed and brought DHI today for calculation  Pt accompanied by: self  PERTINENT HISTORY: hypothyroidism and diabetes insipidus  PAIN:  Are you having pain? No  PRECAUTIONS: None  WEIGHT BEARING RESTRICTIONS: No  FALLS: Has patient fallen in last 6 months? No  LIVING ENVIRONMENT: Lives with: lives with their family Lives in: House/apartment  PLOF: Independent  PATIENT GOALS: improve vision, reduce dizziness, feel normal again  OBJECTIVE:   DIAGNOSTIC FINDINGS: Normal noncontrast MRI appearance of the Brain. IMPRESSION: 1. Minor cervical disc degeneration, most pronounced at C5-C6. Reversal of the normal cervical lordosis there. But no discrete disc herniation. No spinal or foraminal stenosis. 2. Otherwise negative.   VESTIBULAR TREATMENT:  DATE: 03-25-23  Self care: calculated DHI (16%) and explained results to pt - mild disability (borderline); reviewed LTG's and progress and also reviewed HEP with emphasis on x1 viewing exercise as compared to balance on foam; pt reports she is using patterned background for this exercise   Pt was given article on visual dizziness from [facility]  NeuroRe-ed:   SVA - line 11;  DVA line 9 with 3 misreads ( 2.5 line difference); pt requested to sit down after test completed due to provocation of symptoms  Pt amb. Making circles with ball 30' clockwise x 2 reps, counterclockwise x 2 reps; letter "+" pattern 30' x 1 and "S" pattern 30' x 1 rep  Pt performed activities below and added these to HEP  HEP  updated to  include these activities:  1)  WALKING MAKING CIRCLES WITH BALL - CLOCKWISE AND COUNTERCLOCKWISE - "S"  2)  Walking tossing ball on Rt and Lt sides -  3)  Walking and tracking letter - eyes moving opposite letter - head not moving   4)  Walking - VOR cancellation - eyes and head moving in same direction as letter - side to side   PATIENT EDUCATION: Education details: Added smooth pursuits and saccades to HEP; informed pt that she could stand on pillow and use a ball to perform CW and CCW circles with ball for this exercise; educated pt in concept of habituation Person educated: Patient Education method: Programmer, multimedia, Demonstration, and Handouts Education comprehension: verbalized understanding and returned demonstration  HOME EXERCISE PROGRAM: saccades and smooth pursuits added on 03-04-23  Oculomotor: Saccades    Holding two targets positioned side by side  12-18__ inches apart, move eyes quickly from target to target as head stays still. Move __10__ seconds each direction. Perform sitting. Repeat ___1_ times per session. Do __2__ sessions per day.   Oculomotor: Smooth Pursuits    Holding a target, keep eyes on target and slowly move target side to side with head still. Perform sitting. Repeat __2__ times per session. Do __2__ sessions per day. Repeat using target on pattern background.   GOALS: Goals reviewed with patient? Yes   LONG TERM GOALS: Target date: 03-21-23 (pushed out due to schedule availability)  Pt will improve DVA to a </= 2 line difference for improved gaze stabilization.  Baseline: 5 line difference; line 11/line 6;   line 11/ line 9 with 3 misreads ( 2.5 line difference) Goal status: Ongoing - 03-25-23  2.  Pt will subjectively report improvement with visual scanning activities such as texting and emailing on phone and reading.  Baseline:  Goal status: Goal met 03-25-23  3.  Improve FOTO DPS score to >/= 60 to demo improvement in dizziness.  Baseline:  DPS 50;  DFS 58.2 Goal status: INITIAL  4.  Independent in HEP for vestibular exercises.  Baseline:  Goal status: INITIAL  5.  Complete SOT and establish goal as appropriate.   Baseline:  Goal status: INITIAL   ASSESSMENT:  CLINICAL IMPRESSION: Pt's DHI score is 16% (low end of minimal disability category); pt's DVA has significantly improved from a 5 line difference to a 2.5 line difference, however, pt did report significant increase in symptoms upon completion of test.  Pt is progressing well - has improved since setback on 03-02-23 when she was playing with her kids in the pool and did a quick spin around.  Plan D/C next session.   OBJECTIVE IMPAIRMENTS: decreased balance, dizziness, and impaired vision/preception.   ACTIVITY LIMITATIONS:  bending and locomotion level  PARTICIPATION LIMITATIONS: cleaning, interpersonal relationship, shopping, and community activity  PERSONAL FACTORS: 1 comorbidity: vestibular migraine  are also affecting patient's functional outcome.   REHAB POTENTIAL: Good  CLINICAL DECISION MAKING: Stable/uncomplicated  EVALUATION COMPLEXITY: Low   PLAN:  PT FREQUENCY: 1x/week  PT DURATION: 4 weeks  PLANNED INTERVENTIONS: Therapeutic exercises, Therapeutic activity, Neuromuscular re-education, Balance training, Gait training, Patient/Family education, Self Care, and Vestibular training  PLAN FOR NEXT SESSION:  continue with eye exercises - making circles with ball - stand on rockerboard and Airex; back to 60 secs with x1 viewing?   Kary Kos, PT 03/26/2023, 10:52 AM

## 2023-03-26 ENCOUNTER — Encounter: Payer: Self-pay | Admitting: Physical Therapy

## 2023-04-03 ENCOUNTER — Ambulatory Visit: Payer: BC Managed Care – PPO | Admitting: Physical Therapy

## 2023-05-16 ENCOUNTER — Telehealth: Payer: Self-pay

## 2023-05-16 MED ORDER — PREDNISONE 10 MG (21) PO TBPK: ORAL_TABLET | ORAL | 0 refills | Status: DC

## 2023-05-16 NOTE — Telephone Encounter (Signed)
Per patient she did have a bad reaction to the headache cocktail ( Compazine).  Patient prefers the prednisone taper to be called into the pharmacy.   Taper called into the [facility]. .

## 2023-05-16 NOTE — Telephone Encounter (Signed)
How frequent or the headaches (on average, how many days a week/month are they occurring)?   How long do the headaches last?  72 hous.  Verify what preventative medication and dose you are taking (e.g. topiramate, propranolol, amitriptyline, Emgality, etc)  NONE Verify which rescue medication you are taking (triptan, Advil, Excedrin, Aleve, Ubrelvy, etc)  eletriptan-causes Nasuea. Taken on Tuesday  How often are you taking pain relievers/analgesics/rescue mediction?  Tyelonl( Thursday) , Excedrine( Wednesday), Caffieine      Per patient her headaches do not happen that often this one has been bad.

## 2023-05-16 NOTE — Telephone Encounter (Signed)
Notes reviewed, it is not clear if she had a reaction to the migraine cocktail in the ER or it was due to her headache at that time. Does she want to do a headache cocktail in our office or a week of steroid to break headache? Thanks

## 2023-05-30 DIAGNOSIS — R109 Unspecified abdominal pain: Secondary | ICD-10-CM | POA: Diagnosis not present

## 2023-05-30 DIAGNOSIS — Z131 Encounter for screening for diabetes mellitus: Secondary | ICD-10-CM | POA: Diagnosis not present

## 2023-05-30 DIAGNOSIS — Z13220 Encounter for screening for lipoid disorders: Secondary | ICD-10-CM | POA: Diagnosis not present

## 2023-05-30 DIAGNOSIS — E039 Hypothyroidism, unspecified: Secondary | ICD-10-CM | POA: Diagnosis not present

## 2023-05-30 DIAGNOSIS — Z0000 Encounter for general adult medical examination without abnormal findings: Secondary | ICD-10-CM | POA: Diagnosis not present

## 2023-06-02 DIAGNOSIS — R109 Unspecified abdominal pain: Secondary | ICD-10-CM | POA: Insufficient documentation

## 2023-06-04 ENCOUNTER — Ambulatory Visit: Payer: BC Managed Care – PPO | Admitting: Family Medicine

## 2023-06-04 ENCOUNTER — Ambulatory Visit: Payer: BC Managed Care – PPO | Attending: Family Medicine

## 2023-06-04 ENCOUNTER — Encounter: Payer: Self-pay | Admitting: Family Medicine

## 2023-06-04 VITALS — BP 100/64 | HR 76 | Temp 98.8°F | Ht 66.0 in | Wt 120.4 lb

## 2023-06-04 DIAGNOSIS — R002 Palpitations: Secondary | ICD-10-CM | POA: Diagnosis not present

## 2023-06-04 DIAGNOSIS — R42 Dizziness and giddiness: Secondary | ICD-10-CM

## 2023-06-04 DIAGNOSIS — E063 Autoimmune thyroiditis: Secondary | ICD-10-CM

## 2023-06-04 DIAGNOSIS — I959 Hypotension, unspecified: Secondary | ICD-10-CM | POA: Diagnosis not present

## 2023-06-04 LAB — TSH: TSH: 4.33 u[IU]/mL (ref 0.35–5.50)

## 2023-06-04 LAB — CBC WITH DIFFERENTIAL/PLATELET
Basophils Absolute: 0 10*3/uL (ref 0.0–0.1)
Basophils Relative: 0.7 % (ref 0.0–3.0)
Eosinophils Absolute: 0.2 10*3/uL (ref 0.0–0.7)
Eosinophils Relative: 2.8 % (ref 0.0–5.0)
HCT: 44.5 % (ref 36.0–46.0)
Hemoglobin: 14.5 g/dL (ref 12.0–15.0)
Lymphocytes Relative: 34.9 % (ref 12.0–46.0)
Lymphs Abs: 2 10*3/uL (ref 0.7–4.0)
MCHC: 32.6 g/dL (ref 30.0–36.0)
MCV: 92.3 fL (ref 78.0–100.0)
Monocytes Absolute: 0.4 10*3/uL (ref 0.1–1.0)
Monocytes Relative: 6.9 % (ref 3.0–12.0)
Neutro Abs: 3.1 10*3/uL (ref 1.4–7.7)
Neutrophils Relative %: 54.7 % (ref 43.0–77.0)
Platelets: 257 10*3/uL (ref 150.0–400.0)
RBC: 4.82 Mil/uL (ref 3.87–5.11)
RDW: 14.2 % (ref 11.5–15.5)
WBC: 5.7 10*3/uL (ref 4.0–10.5)

## 2023-06-04 LAB — COMPREHENSIVE METABOLIC PANEL
ALT: 21 U/L (ref 0–35)
AST: 18 U/L (ref 0–37)
Albumin: 4.5 g/dL (ref 3.5–5.2)
Alkaline Phosphatase: 45 U/L (ref 39–117)
BUN: 11 mg/dL (ref 6–23)
CO2: 26 meq/L (ref 19–32)
Calcium: 9.4 mg/dL (ref 8.4–10.5)
Chloride: 105 meq/L (ref 96–112)
Creatinine, Ser: 0.64 mg/dL (ref 0.40–1.20)
GFR: 116.52 mL/min (ref >60.00)
Glucose, Bld: 113 mg/dL — ABNORMAL HIGH (ref 70–99)
Potassium: 4.1 meq/L (ref 3.5–5.1)
Sodium: 139 meq/L (ref 135–145)
Total Bilirubin: 0.7 mg/dL (ref 0.2–1.2)
Total Protein: 7.2 g/dL (ref 6.0–8.3)

## 2023-06-04 LAB — FERRITIN: Ferritin: 16.2 ng/mL (ref 10.0–291.0)

## 2023-06-04 LAB — IBC PANEL
Iron: 144 ug/dL (ref 42–145)
Saturation Ratios: 33.6 % (ref 20.0–50.0)
TIBC: 428.4 ug/dL (ref 250.0–450.0)
Transferrin: 306 mg/dL (ref 212.0–360.0)

## 2023-06-04 LAB — VITAMIN B12: Vitamin B-12: 261 pg/mL (ref 211–911)

## 2023-06-04 LAB — HEMOGLOBIN A1C: Hgb A1c MFr Bld: 5.5 % (ref 4.6–6.5)

## 2023-06-04 LAB — T4, FREE: Free T4: 0.75 ng/dL (ref 0.60–1.60)

## 2023-06-04 NOTE — Progress Notes (Signed)
Established Patient Office Visit   Subjective  Patient ID: Kristine Taylor, female    DOB: 09-06-1990  Age: 32 y.o. MRN: [id number]  Chief Complaint  Patient presents with   Dizziness    Started 2 days ago, low bp,     Patient is a 32 year old female seen for acute concern.  Patient endorses dizziness starting 3 days ago.  Patient states she was moving around normally when sensation started.  Felt off balance.  Yesterday BP 96/39.  Patient states it "feels like heart shaking at times".  Feeling better today but still noticing the sensation.  Patient taking Synthroid 112 mcg daily.  Notes going to ED for an episode of dizziness in June.  Given headache cocktail for possible migraine.  Patient states she slept for 20 hours after taking the meds and felt like she was "crawling out of her skin".  Also did vestibular rehab.  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    Permissive genes not present on [facility] testing   Chronic kidney disease    Esophageal dysmotility    GERD (gastroesophageal reflux disease)    Hashimoto's disease    History of stomach ulcers    Hypothyroidism    Lymphocytic hypophysitis (HCC)    Shingles    Vaginal Pap smear, abnormal    Past Surgical History:  Procedure Laterality Date   ESOPHAGEAL MANOMETRY  2017   ESOPHAGOGASTRODUODENOSCOPY  12/2015   LEEP  2018   NASAL SEPTUM SURGERY     NOSE REPAIR  2006   [facility] IMPEDANCE STUDY  2017   Social History   Tobacco Use   Smoking status: Never   Smokeless tobacco: Never  Vaping Use   Vaping status: Never Used  Substance Use Topics   Alcohol use: Not Currently    Comment: OCCASIONAL   Drug use: Never   Family History  Problem Relation Age of Onset   Graves' disease Mother    Colon polyps Mother    Hyperlipidemia Father    Hypertension Father    Healthy Sister    Luiz Blare' disease Maternal Grandfather    Allergies  Allergen Reactions   Gluten Meal    Sumatriptan Other (See Comments)    Jaw and shoulder pain   Wheat Other (See Comments)      Review of Systems  Neurological:  Positive for dizziness.   Negative unless stated  above    Objective:     BP 100/64 (BP Location: Left Arm, Patient Position: Sitting, Cuff Size: Normal)   Pulse 76   Temp 98.8 F (37.1 C) (Oral)   Ht 5\' 6"  (1.676 m)   Wt 120 lb 6.4 oz (54.6 kg)   LMP 05/04/2023 (Exact Date)   SpO2 98%   Breastfeeding No   BMI 19.43 kg/m  BP Readings from Last 3 Encounters:  06/04/23 100/64  02/04/23 (!) 95/57  01/28/23 122/69   Wt Readings from Last 3 Encounters:  06/04/23 120 lb 6.4 oz (54.6 kg)  02/04/23 120 lb (54.4 kg)  01/28/23 125 lb (56.7 kg)  Orthostatic VS for the past 72 hrs (Last 3 readings):  Orthostatic BP Patient Position BP Location Cuff Size Orthostatic Pulse  06/04/23 0923 103/64  Standing Right Arm Normal 81  06/04/23 0911 96/60 Sitting Right Arm Normal 72  06/04/23 0906 104/61 Supine Right Arm Normal 66       Physical Exam Constitutional:      General: She is not in acute distress.    Appearance: Normal appearance.  HENT:     Head: Normocephalic and atraumatic.     Nose: Nose normal.     Mouth/Throat:     Mouth: Mucous membranes are moist.  Eyes:     General: Gaze aligned appropriately.     Extraocular Movements: Extraocular movements intact.     Right eye: Normal extraocular motion and no nystagmus.     Left eye: Normal extraocular motion and no nystagmus.  Cardiovascular:     Rate and Rhythm: Normal rate and regular rhythm.     Heart sounds: Normal heart sounds. No murmur heard.    No gallop.  Pulmonary:     Effort: Pulmonary effort is normal. No respiratory distress.     Breath sounds: Normal breath sounds. No wheezing, rhonchi or rales.  Musculoskeletal:        General: Normal range of motion.  Skin:    General: Skin is warm and dry.  Neurological:     Mental Status: She is alert and oriented to person, place, and time. Mental status is at baseline.      06/04/2023    8:28 AM 06/23/2020    4:14 PM  GAD 7 : Generalized Anxiety Score  Nervous, Anxious, on Edge 0 0  Control/stop worrying 0 0  Worry too much - different things 0 0  Trouble relaxing 0 0  Restless 0 0  Easily annoyed or irritable 0 0  Afraid - awful might happen 0 0  Total GAD 7 Score 0 0  Anxiety Difficulty Not difficult at all Not difficult at all       06/04/2023    8:28 AM 06/23/2020    4:12 PM  Depression screen PHQ 2/9  Decreased Interest 0 0  Down, Depressed, Hopeless 0 0  PHQ - 2 Score 0 0  Altered sleeping 0 0  Tired, decreased energy 0 0  Change in appetite 0 0  Feeling bad or failure about yourself  0 0  Trouble concentrating 0 0  Moving slowly or fidgety/restless 0 0  Suicidal thoughts 0 0  PHQ-9 Score 0 0  Difficult doing work/chores Not difficult  at all Not difficult at all    No results found for any visits on 06/04/23.    Assessment & Plan:  Palpitations -     EKG 12-Lead -     Comprehensive metabolic panel -  CBC with Differential/Platelet -     TSH -     T4, free -     Vitamin B12 -     LONG TERM MONITOR (3-14 DAYS); Future -     IBC panel; Future -     Ferritin; Future  Dizziness -     EKG 12-Lead -     CBC with Differential/Platelet -     TSH -     T4, free -     Hemoglobin A1c -     LONG TERM MONITOR (3-14 DAYS); Future -     IBC panel; Future -     Ferritin; Future  Hypothyroidism due to Hashimoto thyroiditis -     IBC panel; Future -     Ferritin; Future  Hypotension, unspecified hypotension type  Patient with acute episode of dizziness and hypotension.  Discussed possible causes including arrhythmia, vertigo, dehydration, anxiety, medications, electrolyte abnormality, thyroid dysfunction.  PHQ-9 and GAD-7 score 0.  Orthostatics in clinic negative.EKG obtained, NSR.  Studies from 07/06/2021 and 03/07/2021 reviewed for comparison.  Order placed for at home Zio patch.  Continue current dose of Synthroid 112 mcg.  Adjust if needed based on labs.  Discussed cardiology referral if needed.  Given strict precautions.  Return in about 4 weeks (around 07/02/2023), or if symptoms worsen or fail to improve.   Deeann Saint, MD

## 2023-06-04 NOTE — Progress Notes (Unsigned)
EP to read

## 2023-06-06 DIAGNOSIS — R42 Dizziness and giddiness: Secondary | ICD-10-CM | POA: Diagnosis not present

## 2023-06-06 DIAGNOSIS — R002 Palpitations: Secondary | ICD-10-CM

## 2023-06-11 ENCOUNTER — Encounter: Payer: Self-pay | Admitting: Family Medicine

## 2023-06-19 DIAGNOSIS — E039 Hypothyroidism, unspecified: Secondary | ICD-10-CM | POA: Diagnosis not present

## 2023-06-19 DIAGNOSIS — R109 Unspecified abdominal pain: Secondary | ICD-10-CM | POA: Diagnosis not present

## 2023-06-19 DIAGNOSIS — Z01411 Encounter for gynecological examination (general) (routine) with abnormal findings: Secondary | ICD-10-CM | POA: Diagnosis not present

## 2023-06-19 DIAGNOSIS — Z1331 Encounter for screening for depression: Secondary | ICD-10-CM | POA: Diagnosis not present

## 2023-06-19 DIAGNOSIS — Z124 Encounter for screening for malignant neoplasm of cervix: Secondary | ICD-10-CM | POA: Diagnosis not present

## 2023-06-19 LAB — HM PAP SMEAR: HM Pap smear: NEGATIVE

## 2023-08-07 DIAGNOSIS — I8391 Asymptomatic varicose veins of right lower extremity: Secondary | ICD-10-CM | POA: Diagnosis not present

## 2023-08-07 DIAGNOSIS — L728 Other follicular cysts of the skin and subcutaneous tissue: Secondary | ICD-10-CM | POA: Diagnosis not present

## 2023-08-07 DIAGNOSIS — L814 Other melanin hyperpigmentation: Secondary | ICD-10-CM | POA: Diagnosis not present

## 2023-08-07 DIAGNOSIS — D225 Melanocytic nevi of trunk: Secondary | ICD-10-CM | POA: Diagnosis not present

## 2023-08-07 DIAGNOSIS — L821 Other seborrheic keratosis: Secondary | ICD-10-CM | POA: Diagnosis not present

## 2023-08-14 DIAGNOSIS — E039 Hypothyroidism, unspecified: Secondary | ICD-10-CM | POA: Diagnosis not present

## 2023-10-31 ENCOUNTER — Ambulatory Visit: Admitting: Family Medicine

## 2023-10-31 ENCOUNTER — Encounter: Payer: Self-pay | Admitting: Family Medicine

## 2023-10-31 VITALS — BP 98/66 | HR 83 | Temp 98.3°F | Ht 66.0 in | Wt 124.4 lb

## 2023-10-31 DIAGNOSIS — E162 Hypoglycemia, unspecified: Secondary | ICD-10-CM

## 2023-10-31 DIAGNOSIS — E063 Autoimmune thyroiditis: Secondary | ICD-10-CM

## 2023-10-31 NOTE — Patient Instructions (Signed)
 Will keep an eye on your blood sugar with the continuous glucometer.  See if making small changes to your preworkout diet makes a difference.  Will follow back up to see how things are going in the next few weeks.

## 2023-10-31 NOTE — Progress Notes (Signed)
 Established Patient Office Visit   Subjective  Patient ID: Kristine Taylor, female    DOB: [birth year]  Age: 33 y.o. MRN: [id number]  No chief complaint on file.   Patient is a 33 year old female seen for acute concern.  Patient states she has been experiencing nausea, sweaty, shakiness 20 minutes post working out.  Sensation started 3-4 weeks ago.  Patient typically eats some butter, sourdough, cheese prior to working out at 5 PM.  Typically has dinner at 530 or 6 PM.  Will have lunch around 11 and a snack around 2 PM with her kids.  Patient denies changes in workout routine/intensity.  Patient notes a new manufacturer for her Synthroid.  Pills changed 2 months ago.  Patient stat has a history of diabetes.    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    Permissive genes not present on [facility]  testing   Chronic kidney disease    Esophageal dysmotility    GERD (gastroesophageal reflux disease)    Hashimoto's disease    History of stomach ulcers    Hypothyroidism    Lymphocytic hypophysitis (HCC)    Shingles    Vaginal Pap smear, abnormal    Past Surgical History:  Procedure Laterality Date   ESOPHAGEAL MANOMETRY  2017   ESOPHAGOGASTRODUODENOSCOPY  12/2015   LEEP  2018   NASAL SEPTUM SURGERY     NOSE REPAIR  2006   [facility] IMPEDANCE STUDY  2017   Social History   Tobacco Use   Smoking status: Never   Smokeless tobacco: Never  Vaping Use   Vaping status: Never Used  Substance Use Topics   Alcohol use: Not Currently    Comment: OCCASIONAL   Drug use: Never   Family History  Problem Relation Age of Onset   Graves' disease Mother    Colon polyps Mother    Hyperlipidemia Father    Hypertension Father    Healthy Sister    Luiz Blare' disease Maternal Grandfather    Allergies  Allergen Reactions   Gluten Meal    Sumatriptan Other (See Comments)    Jaw and shoulder pain   Wheat Other (See Comments)      ROS Negative unless stated above    Objective:     BP 98/66 (BP Location: Left Arm, Patient Position: Sitting, Cuff Size: Normal)   Pulse 83   Temp 98.3 F (36.8 C) (Oral)   Ht  5\' 6"  (1.676 m)   Wt 124 lb 6.4 oz (56.4 kg)   SpO2 96%   BMI 20.08 kg/m  BP Readings from Last 3 Encounters:  10/31/23 98/66  06/04/23 100/64  02/04/23 (!) 95/57   Wt Readings from Last 3 Encounters:  10/31/23 124 lb 6.4 oz (56.4 kg)  06/04/23 120 lb 6.4 oz (54.6 kg)  02/04/23 120 lb (54.4 kg)      Physical Exam Constitutional:      Appearance: Normal appearance.  HENT:     Head: Normocephalic and atraumatic.     Nose: Nose normal.  Eyes:     Extraocular Movements: Extraocular movements intact.     Conjunctiva/sclera: Conjunctivae normal.     Pupils: Pupils are equal, round, and reactive to light.  Neurological:     Mental Status: She is alert.    No results  found for any visits on 10/31/23.    Assessment & Plan:  Hypoglycemia -     Hemoglobin A1c; Future -     Basic metabolic panel; Future -     TSH; Future -     T3, free; Future  Hypothyroidism due to Hashimoto thyroiditis -     TSH; Future -     T3, free; Future -     T4, free; Future  Pt with symptoms of exercise-induced hypoglycemia.  Also consider hyperinsulinemia.  Given CGM in clinic to monitor blood sugar.  Advised to change preworkout snacks/meal to see if notices a change in symptoms.  Change in levothyroxine manufacturer may also be contributing as pt can tell a difference.  Pt advised to try contacting pharmacy to see if refills of the previous manufacturer are available. If needed will provide a new script.  Return in about 2 weeks (around 11/14/2023), or if symptoms worsen or fail to improve.   Deeann Saint, MD

## 2023-11-01 LAB — T3, FREE: T3, Free: 3.1 pg/mL (ref 2.3–4.2)

## 2023-11-01 LAB — HEMOGLOBIN A1C
Hgb A1c MFr Bld: 5.3 %{Hb} (ref <5.7)
Mean Plasma Glucose: 105 mg/dL
eAG (mmol/L): 5.8 mmol/L

## 2023-11-01 LAB — TSH: TSH: 2.36 m[IU]/L

## 2023-11-01 LAB — BASIC METABOLIC PANEL
BUN: 14 mg/dL (ref 7–25)
CO2: 27 mmol/L (ref 20–32)
Calcium: 9.8 mg/dL (ref 8.6–10.2)
Chloride: 103 mmol/L (ref 98–110)
Creat: 0.6 mg/dL (ref 0.50–0.97)
Glucose, Bld: 89 mg/dL (ref 65–99)
Potassium: 3.9 mmol/L (ref 3.5–5.3)
Sodium: 139 mmol/L (ref 135–146)

## 2023-11-01 LAB — T4, FREE: Free T4: 1.1 ng/dL (ref 0.8–1.8)

## 2023-11-02 ENCOUNTER — Encounter: Payer: Self-pay | Admitting: Family Medicine

## 2023-11-03 ENCOUNTER — Encounter: Payer: Self-pay | Admitting: Family Medicine

## 2023-11-04 ENCOUNTER — Emergency Department (HOSPITAL_BASED_OUTPATIENT_CLINIC_OR_DEPARTMENT_OTHER)
Admission: EM | Admit: 2023-11-04 | Discharge: 2023-11-04 | Disposition: A | Discharge status: Home / Self Care | Attending: Emergency Medicine | Admitting: Emergency Medicine

## 2023-11-04 ENCOUNTER — Other Ambulatory Visit: Payer: Self-pay

## 2023-11-04 DIAGNOSIS — S0990XA Unspecified injury of head, initial encounter: Secondary | ICD-10-CM | POA: Diagnosis not present

## 2023-11-04 DIAGNOSIS — W500XXA Accidental hit or strike by another person, initial encounter: Secondary | ICD-10-CM | POA: Diagnosis not present

## 2023-11-04 DIAGNOSIS — S0083XA Contusion of other part of head, initial encounter: Secondary | ICD-10-CM | POA: Insufficient documentation
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
## 2023-11-04 NOTE — Discharge Instructions (Signed)
 Clinically at this time no real symptoms consistent with concussion.  But I think he did being your nerves coming out on the right side of your face there in front of the ear.  Could take some time for them to recover.  Would recommend Exer strength Tylenol 2 tablets every 8 hours.  As we discussed it is possible there could be an injury to the bone in that area but without any bruising very unlikely.  Return for any new or worse symptoms.  Follow-up with your doctor as needed.
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
## 2023-11-10 ENCOUNTER — Telehealth: Payer: Self-pay

## 2023-11-10 NOTE — Telephone Encounter (Signed)
 Will place replacement at the front desk for pick-up

## 2023-11-10 NOTE — Telephone Encounter (Signed)
 Copied from CRM [id number]. Topic: General - Other >> Nov 10, 2023  9:56 AM Isabell A wrote: Reason for CRM: Patient states she spoke with Tacey Ruiz in regard to picking up a new glucose monitor, first one she received was broken.   Callback number: [phone]

## 2023-11-24 ENCOUNTER — Ambulatory Visit: Admitting: Family Medicine

## 2023-11-24 ENCOUNTER — Encounter: Payer: Self-pay | Admitting: Family Medicine

## 2023-11-24 VITALS — BP 98/68 | HR 76 | Temp 98.7°F | Ht 65.0 in | Wt 126.6 lb

## 2023-11-24 DIAGNOSIS — Z8639 Personal history of other endocrine, nutritional and metabolic disease: Secondary | ICD-10-CM | POA: Diagnosis not present

## 2023-11-24 DIAGNOSIS — E063 Autoimmune thyroiditis: Secondary | ICD-10-CM | POA: Diagnosis not present

## 2023-11-24 DIAGNOSIS — E162 Hypoglycemia, unspecified: Secondary | ICD-10-CM | POA: Diagnosis not present
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
## 2024-01-27 ENCOUNTER — Encounter: Payer: Self-pay | Admitting: "Endocrinology

## 2024-01-27 ENCOUNTER — Ambulatory Visit: Admitting: "Endocrinology

## 2024-01-27 VITALS — BP 100/70 | HR 75 | Ht 65.0 in | Wt 126.0 lb

## 2024-01-27 DIAGNOSIS — E162 Hypoglycemia, unspecified: Secondary | ICD-10-CM | POA: Diagnosis not present

## 2024-01-27 NOTE — Progress Notes (Signed)
 Outpatient Endocrinology Note Jorge Newcomer, MD    Mindie Rawdon [facility] 09/27/1990 [id number]  Referring Provider: Viola Greulich, MD Primary Care Provider: Viola Greulich, MD Reason for consultation: Subjective   Assessment & Plan  Diagnoses and all orders for this visit:  Hypoglycemia -     Sulfonylurea Hypoglycemics Panel, blood -     Basic metabolic panel with GFR -     Insulin, random -     Insulin antibodies, blood -     C-peptide -     Beta-hydroxybutyric acid -     Proinsulin -     Cortisol -     Glucagon -     Insulin-like growth factor  Hypoglycemia overnight as well as after meals She has glucose meter to check her blood sugars, but is not keen on wearing CGM due to excess data. Reviewing the CGM trends, BG are low normal across the day, specially overnight. Patient reports that she has been working out for a long time now but recently has started experiencing symptoms that correct after she eats Clementina/pineapple within 5 minutes. She reports head ache, dizzy, sweaty after work out one time in 2025, that led her to start eating more protein which did not help. After that she started eating clementine/pineapple before work out and that seemed to help.  Recommend high protein diet with complex carbs: shared examples with hand out Incorporate a big snack between lunch and dinner Given hand out for meal tracking 8 am fasting labs If low BG continue, start a spoonful of cornstarch with cottage cheese am and pm   Return in about 2 weeks (around 02/10/2024) for visit and 8 am labs before next visit.   I have reviewed current medications, nurse's notes, allergies, vital signs, past medical and surgical history, family medical history, and social history for this encounter. Counseled patient on symptoms, examination findings, lab findings, imaging results, treatment decisions and monitoring and prognosis. The patient understood the recommendations and agrees  with the treatment plan. All questions regarding treatment plan were fully answered.  Jorge Newcomer, MD  01/27/24   History of Present Illness HPI  Kristine Taylor is a 33 y.o. year old female who presents for evaluation of hypoglycemia.  Patient reports that she has been working out for a long time now but recently has started experiencing symptoms that correct after she eats Clementina/pineapple within 5 minutes.  She reports head ache, dizzy, sweaty after work out one time in 2025, that led her to start eating more protein which did not help. After that she started eating clementine/pineapple before work out and that seemed to help.   The last time she had this episode was yesterday.  Her primary care made her wear a CGM and it reported low blood sugars overnight, which match with patient's symptoms of being sweaty.  She reports being sweaty overnight since a long time.  She has glucose meter to check her blood sugars, but is not keen on wearing CGM due to excess data.  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  She eats vegetarian and prefers meat at dinner.  She does not track her meals but reports that she eats adequate protein.  11/11/23-11/24/22 CGM interpretation: At today's visit, we reviewed her CGM downloads. The full report is scanned in the media. Reviewing the CGM trends, BG are low normal across the day, specially overnight.      Physical Exam  BP 100/70  Pulse 75   Ht 5\' 5"  (1.651 m)   Wt 126 lb (57.2 kg)   SpO2 98%   BMI 20.97 kg/m    Constitutional: well developed, well nourished Head: normocephalic, atraumatic Eyes: sclera anicteric, no redness Neck: supple Lungs: normal respiratory effort Neurology: alert and oriented Skin: dry, no appreciable rashes Musculoskeletal: no appreciable defects Psychiatric: normal mood and affect   Current Medications Patient's Medications  New Prescriptions   No medications on file  Previous  Medications   LEVOTHYROXINE  (SYNTHROID ) 112 MCG TABLET    Take 112 mcg by mouth daily before breakfast.   LEVOTHYROXINE  (SYNTHROID ) 88 MCG TABLET       TRETINOIN (RETIN-A) 0.025 % CREAM    Apply topically.  Modified Medications   No medications on file  Discontinued Medications   No medications on file    Allergies Allergies  Allergen Reactions   Gluten Meal    Sumatriptan  Other (See Comments)    Jaw and shoulder pain   Wheat Other (See Comments)    Past Medical History Past Medical History:  Diagnosis Date   Arthritis    Asthma    Celiac disease?    Permissive genes not present on [facility] testing   Chronic kidney disease    Esophageal dysmotility    GERD (gastroesophageal reflux disease)    Hashimoto's disease    History of stomach ulcers    Hypothyroidism    Lymphocytic hypophysitis (HCC)    Shingles    Vaginal Pap smear, abnormal     Past Surgical History Past Surgical History:  Procedure Laterality Date   ESOPHAGEAL MANOMETRY  2017   ESOPHAGOGASTRODUODENOSCOPY  12/2015   LEEP  2018   NASAL SEPTUM SURGERY     NOSE REPAIR  2006   [facility] IMPEDANCE STUDY  2017    Family History family history includes Colon polyps in her mother; Murrell Arrant' disease in her maternal grandfather and mother; Healthy in her sister; Hyperlipidemia in her father; Hypertension in her father.  Social History Social History   Socioeconomic History   Marital status: Married    Spouse name: Not on file   Number of children: 0   Years of education: Not on file   Highest education level: Bachelor's degree (e.g., BA, AB, BS)  Occupational History   Occupation: Business development  Tobacco Use   Smoking status: Never   Smokeless tobacco: Never  Vaping Use   Vaping status: Never Used  Substance and Sexual Activity   Alcohol use: Not Currently    Comment: OCCASIONAL   Drug use: Never   Sexual activity: Yes    Partners: Male  Other Topics Concern   Not on file  Social History  Narrative   Married, no children   Patent examiner - [employer]   Raised in [place]   Rare alcohol, no tobacco or drugs   Right handed       Social Drivers of Corporate investment banker Strain: Low Risk  (11/23/2023)   Overall Financial Resource Strain (CARDIA)    Difficulty of Paying Living Expenses: Not hard at all  Food Insecurity: No Food Insecurity (11/23/2023)   Hunger Vital Sign    Worried About Running Out of Food in the Last Year: Never true    Ran Out of Food in the Last Year: Never true  Transportation Needs: No Transportation Needs (11/23/2023)   PRAPARE - Administrator, Civil Service (Medical): No    Lack of Transportation (  Non-Medical): No  Physical Activity: Sufficiently Active (11/23/2023)   Exercise Vital Sign    Days of Exercise per Week: 7 days    Minutes of Exercise per Session: 60 min  Stress: No Stress Concern Present (11/23/2023)   [employer] of Occupational Health - Occupational Stress Questionnaire    Feeling of Stress : Not at all  Social Connections: Moderately Isolated (11/23/2023)   Social Connection and Isolation Panel [NHANES]    Frequency of Communication with Friends and Family: More than three times a week    Frequency of Social Gatherings with Friends and Family: Twice a week    Attends Religious Services: Never    Database administrator or Organizations: No    Attends Engineer, structural: Not on file    Marital Status: Married  Intimate Partner Violence: Unknown (11/23/2021)   Received from [facility]   HITS    Physically Hurt: Not on file    Insult or Talk Down To: Not on file    Threaten Physical Harm: Not on file    Scream or Curse: Not on file    No results found for: "CHOL" No results found for: "HDL" No results found for: "LDLCALC" No results found for: "TRIG" No results found for: "CHOLHDL" Lab Results  Component Value Date   CREATININE 0.60 10/31/2023   Lab Results   Component Value Date   GFR 116.52 06/04/2023      Component Value Date/Time   NA 139 10/31/2023 1627   K 3.9 10/31/2023 1627   CL 103 10/31/2023 1627   CO2 27 10/31/2023 1627   GLUCOSE 89 10/31/2023 1627   BUN 14 10/31/2023 1627   CREATININE 0.60 10/31/2023 1627   CALCIUM 9.8 10/31/2023 1627   PROT 7.2 06/04/2023 0951   ALBUMIN 4.5 06/04/2023 0951   AST 18 06/04/2023 0951   ALT 21 06/04/2023 0951   ALKPHOS 45 06/04/2023 0951   BILITOT 0.7 06/04/2023 0951   GFRNONAA >60 01/28/2023 1000      Latest Ref Rng & Units 10/31/2023    4:27 PM 06/04/2023    9:51 AM 01/28/2023   10:00 AM  BMP  Glucose 65 - 99 mg/dL 89  161  096   BUN 7 - 25 mg/dL 14  11  9    Creatinine 0.50 - 0.97 mg/dL 0.45  4.09  8.11   BUN/Creat Ratio 6 - 22 (calc) SEE NOTE:     Sodium 135 - 146 mmol/L 139  139  142   Potassium 3.5 - 5.3 mmol/L 3.9  4.1  3.6   Chloride 98 - 110 mmol/L 103  105  109   CO2 20 - 32 mmol/L 27  26  25    Calcium 8.6 - 10.2 mg/dL 9.8  9.4  9.2        Component Value Date/Time   WBC 5.7 06/04/2023 0951   RBC 4.82 06/04/2023 0951   HGB 14.5 06/04/2023 0951   HCT 44.5 06/04/2023 0951   PLT 257.0 06/04/2023 0951   MCV 92.3 06/04/2023 0951   MCH 30.6 01/28/2023 1000   MCHC 32.6 06/04/2023 0951   RDW 14.2 06/04/2023 0951   LYMPHSABS 2.0 06/04/2023 0951   MONOABS 0.4 06/04/2023 0951   EOSABS 0.2 06/04/2023 0951   BASOSABS 0.0 06/04/2023 0951   Lab Results  Component Value Date   TSH 2.36 10/31/2023   TSH 4.33 06/04/2023   TSH 1.61 09/18/2020   FREET4 1.1 10/31/2023   FREET4 0.75  06/04/2023         Parts of this note may have been dictated using voice recognition software. There may be variances in spelling and vocabulary which are unintentional. Not all errors are proofread. Please notify the [facility] if any discrepancies are noted or if the meaning of any statement is not clear.

## 2024-01-27 NOTE — Patient Instructions (Signed)
10-Point Nutrition Plan for Preventing Hypoglycemia Control portions of carbohydrate - 30 grams/meal, 15 grams/snack. Choose low-glycemic carbohydrates. Avoid high-glycemic carbohydrates. Include (heart-healthy) fats in each meal or snack - 15 grams/meal, 5 grams/snack. Emphasize optimal protein intake. Space meals/snacks 3-4 hours apart. Avoid consuming liquids with meals. Avoid alcohol. Avoid caffeine. Maintain post-bariatric vitamin and mineral intake.   Low Glycemic Index Carbohydrates (CHOOSE) Steel-cut oats (regular, not quick-cook or instant) Oat bran cereal Beans/legumes (e.g., garbanzo, navy, kidney, lima, pinto, black-eyed and pea beans, edamame (soybeans), lentils Bean products (e.g., hummus, tofu) Pearled barley, cooked al dente Yams Some fruits (e.g., grapefruit, apples, pears, berries, apricots, peaches) Some pasta (e.g., Barilla Plus pasta), cooked al dente Some whole grain breads (e.g., Ezekiel bread, Joseph's Flax, Oat Bran & Whole Wheat Pita/Lavash/Tortillas) Some whole grain crackers (e.g., RyKrisp, RyVita, Wasa) Brown rice, wild rice Quinoa  Buckwheat (a grass)   High Glycemic Index Carbohydrates (AVOID) Refined breakfast cereals (e.g., Corn Flakes, Rice Krispies, Cream of Rice, instant oatmeal) Regular pasta Most starchy vegetables (e.g., white potatoes, corn, winter (orange) squash) White rice, rice cakes Popcorn, pretzels, chips Some fruits (e.g., ripe bananas, pineapple, mango, watermelon, grapes) All fruit juices and sweetened drinks (e.g., sodas, sweetened iced tea) Bread, rolls, bagels, English muffins, and crackers made with refined flour Sweets (e.g., candy, cake, cookies, ice cream, syrup)   Heart-Healthy Fats (Adapt) Nuts, nut butters Avocado, guacamole Olives Most plant oils (e.g., olive, canola, peanut, soy, sunflower, sesame) Most seeds (e.g., sunflower, flax, sesame/sesame tahini) Oily fish (e.g., salmon, bluefish, mackerel, tuna,  sardines)

## 2024-02-05 ENCOUNTER — Other Ambulatory Visit

## 2024-02-05 DIAGNOSIS — E162 Hypoglycemia, unspecified: Secondary | ICD-10-CM | POA: Diagnosis not present

## 2024-02-12 ENCOUNTER — Ambulatory Visit: Admitting: "Endocrinology

## 2024-02-13 LAB — GLUCAGON: Glucagon Lvl: 8 pg/mL — ABNORMAL LOW (ref 11–78)

## 2024-02-13 LAB — SULFONYLUREA HYPOGLYCEMICS PANEL, SERUM
Chlorpropamide: NOT DETECTED ug/mL
Glimepiride: NOT DETECTED ng/mL
Glipizide: NOT DETECTED ng/mL
Glyburide: NOT DETECTED ng/mL
Nateglinide: NOT DETECTED ug/mL
Pioglitazone: NOT DETECTED ng/mL
Repaglinide: NOT DETECTED ng/mL
Rosiglitazone: NOT DETECTED ng/mL
Tolazamide: NOT DETECTED ug/mL
Tolbutamide: NOT DETECTED ug/mL

## 2024-02-13 LAB — CORTISOL: Cortisol, Plasma: 11.6 ug/dL

## 2024-02-13 LAB — BASIC METABOLIC PANEL WITH GFR
BUN: 12 mg/dL (ref 7–25)
CO2: 28 mmol/L (ref 20–32)
Calcium: 9.4 mg/dL (ref 8.6–10.2)
Chloride: 104 mmol/L (ref 98–110)
Creat: 0.65 mg/dL (ref 0.50–0.97)
Glucose, Bld: 96 mg/dL (ref 65–99)
Potassium: 4.2 mmol/L (ref 3.5–5.3)
Sodium: 139 mmol/L (ref 135–146)
eGFR: 119 mL/min/{1.73_m2} (ref >=60)

## 2024-02-13 LAB — PROINSULIN: Proinsulin: <4 pmol/L (ref <=18.8)

## 2024-02-13 LAB — INSULIN-LIKE GROWTH FACTOR
IGF-I, LC/MS: 201 ng/mL (ref 53–331)
Z-Score (Female): 0.7 {STDV} (ref ?–2.0)

## 2024-02-13 LAB — INSULIN ANTIBODIES, BLOOD: Insulin Antibodies, Human: <0.4 U/mL (ref <0.4)

## 2024-02-13 LAB — BETA-HYDROXYBUTYRATE: Beta-Hydroxybutyric Acid: 0.06 mmol/L

## 2024-02-13 LAB — INSULIN, RANDOM: Insulin: 5.4 u[IU]/mL

## 2024-02-13 LAB — C-PEPTIDE: C-Peptide: 1.48 ng/mL (ref 0.80–3.85)

## 2024-02-23 DIAGNOSIS — R251 Tremor, unspecified: Secondary | ICD-10-CM | POA: Diagnosis not present

## 2024-03-04 ENCOUNTER — Ambulatory Visit: Admitting: "Endocrinology

## 2024-04-22 ENCOUNTER — Ambulatory Visit: Payer: Self-pay | Admitting: Family Medicine

## 2024-04-22 ENCOUNTER — Encounter: Payer: Self-pay | Admitting: Family Medicine

## 2024-04-22 ENCOUNTER — Ambulatory Visit: Admitting: Family Medicine

## 2024-04-22 VITALS — BP 102/64 | HR 87 | Temp 98.6°F | Ht 65.0 in | Wt 128.0 lb

## 2024-04-22 DIAGNOSIS — R195 Other fecal abnormalities: Secondary | ICD-10-CM

## 2024-04-22 DIAGNOSIS — E063 Autoimmune thyroiditis: Secondary | ICD-10-CM | POA: Diagnosis not present

## 2024-04-22 LAB — COMPREHENSIVE METABOLIC PANEL WITH GFR
ALT: 14 U/L (ref 0–35)
AST: 16 U/L (ref 0–37)
Albumin: 4.4 g/dL (ref 3.5–5.2)
Alkaline Phosphatase: 42 U/L (ref 39–117)
BUN: 15 mg/dL (ref 6–23)
CO2: 27 meq/L (ref 19–32)
Calcium: 8.9 mg/dL (ref 8.4–10.5)
Chloride: 105 meq/L (ref 96–112)
Creatinine, Ser: 0.68 mg/dL (ref 0.40–1.20)
GFR: 114.12 mL/min (ref >60.00)
Glucose, Bld: 91 mg/dL (ref 70–99)
Potassium: 4 meq/L (ref 3.5–5.1)
Sodium: 140 meq/L (ref 135–145)
Total Bilirubin: 0.8 mg/dL (ref 0.2–1.2)
Total Protein: 7.1 g/dL (ref 6.0–8.3)

## 2024-04-22 LAB — CBC WITH DIFFERENTIAL/PLATELET
Basophils Absolute: 0 K/uL (ref 0.0–0.1)
Basophils Relative: 0.7 % (ref 0.0–3.0)
Eosinophils Absolute: 0.1 K/uL (ref 0.0–0.7)
Eosinophils Relative: 2.1 % (ref 0.0–5.0)
HCT: 40.6 % (ref 36.0–46.0)
Hemoglobin: 13.6 g/dL (ref 12.0–15.0)
Lymphocytes Relative: 35.2 % (ref 12.0–46.0)
Lymphs Abs: 2 K/uL (ref 0.7–4.0)
MCHC: 33.4 g/dL (ref 30.0–36.0)
MCV: 92.4 fl (ref 78.0–100.0)
Monocytes Absolute: 0.6 K/uL (ref 0.1–1.0)
Monocytes Relative: 9.9 % (ref 3.0–12.0)
Neutro Abs: 2.9 K/uL (ref 1.4–7.7)
Neutrophils Relative %: 52.1 % (ref 43.0–77.0)
Platelets: 233 K/uL (ref 150.0–400.0)
RBC: 4.4 Mil/uL (ref 3.87–5.11)
RDW: 13.2 % (ref 11.5–15.5)
WBC: 5.5 K/uL (ref 4.0–10.5)

## 2024-04-22 LAB — LIPASE: Lipase: 25 U/L (ref 11.0–59.0)

## 2024-04-22 LAB — TSH: TSH: 1.77 u[IU]/mL (ref 0.35–5.50)

## 2024-04-22 LAB — T3, FREE: T3, Free: 2.9 pg/mL (ref 2.3–4.2)

## 2024-04-22 LAB — T4, FREE: Free T4: 0.77 ng/dL (ref 0.60–1.60)

## 2024-04-22 NOTE — Patient Instructions (Signed)
 A referral to GI was placed.  They should call you about setting up the appointment.

## 2024-04-22 NOTE — Progress Notes (Signed)
 Established Patient Office Visit   Subjective  Patient ID: Kristine Taylor, female    DOB: 1990-11-14  Age: 33 y.o. MRN: [id number]  Chief Complaint  Patient presents with   Acute Visit    Patient came in today for Bowel changes, started a month ago, patient has a bowel movement every time she goes to the bathroom about 8 times a day, diarrhea- formed stool, patient is having bloating but denies any pain      Patient is a 33 year old female seen for ongoing concern.  Patient endorses changes in stools x 2 months.  Having 8-10 looser stools per day.  Also endorses bloating, feeling constipated like unable to completely empty bowels, and mild abdominal cramping.  Patient denies changes in diet or medications.  Typically eats a lot of fiber.  On Synthroid  100 mcg daily.    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    Permissive genes not present on [facility] testing   Chronic kidney disease    Esophageal dysmotility    GERD (gastroesophageal reflux disease)    Hashimoto's disease    History of stomach ulcers    Hypothyroidism    Lymphocytic hypophysitis (HCC)    Shingles    Vaginal Pap smear, abnormal    Past Surgical History:  Procedure Laterality Date   ESOPHAGEAL MANOMETRY  2017   ESOPHAGOGASTRODUODENOSCOPY  12/2015   LEEP  2018   NASAL SEPTUM SURGERY     NOSE REPAIR  2006   [facility] IMPEDANCE STUDY  2017   Social History   Tobacco Use   Smoking status: Never   Smokeless tobacco: Never  Vaping Use   Vaping status: Never Used  Substance Use Topics   Alcohol use: Not Currently    Comment: OCCASIONAL   Drug use: Never   Family History  Problem Relation Age of Onset   Graves' disease Mother    Colon polyps Mother    Hyperlipidemia Father    Hypertension Father    Healthy Sister    Yvone' disease Maternal Grandfather    Allergies  Allergen Reactions   Gluten Meal    Sumatriptan  Other (See Comments)    Jaw and shoulder pain   Wheat Other (See Comments)    ROS Negative unless stated above    Objective:     BP 102/64 (BP Location: Left Arm, Patient Position: Sitting, Cuff Size: Normal)   Pulse 87  Temp 98.6 F (37 C) (Oral)   Ht 5' 5 (1.651 m)   Wt 128 lb (58.1 kg)   LMP 03/28/2024 (Exact Date)   SpO2 99%   BMI 21.30 kg/m  BP Readings from Last 3 Encounters:  04/22/24 102/64  01/27/24 100/70  11/24/23 98/68   Wt Readings from Last 3 Encounters:  04/22/24 128 lb (58.1 kg)  01/27/24 126 lb (57.2 kg)  11/24/23 126 lb 9.6 oz (57.4 kg)      Physical Exam Constitutional:      General: She is not in acute distress.    Appearance: Normal appearance.  HENT:     Head: Normocephalic and atraumatic.     Nose: Nose normal.     Mouth/Throat:     Mouth: Mucous membranes are moist.  Cardiovascular:     Rate and Rhythm: Normal rate and regular  rhythm.     Heart sounds: Normal heart sounds. No murmur heard.    No gallop.  Pulmonary:     Effort: Pulmonary effort is normal. No respiratory distress.     Breath sounds: Normal breath sounds. No wheezing, rhonchi or rales.  Abdominal:     General: Abdomen is flat. Bowel sounds are normal.     Palpations: Abdomen is soft.     Tenderness: There is abdominal tenderness in the suprapubic area and left lower quadrant.  Skin:    General: Skin is warm and dry.  Neurological:     Mental Status: She is alert and oriented to person, place, and time.        11/24/2023    9:18 AM 06/04/2023    8:28 AM 06/23/2020    4:12 PM  Depression screen PHQ 2/9  Decreased Interest 0 0 0  Down, Depressed, Hopeless 0 0 0  PHQ - 2 Score 0 0 0  Altered sleeping 0 0 0  Tired, decreased energy 0 0 0  Change in appetite 0 0 0  Feeling bad or failure about yourself  0 0 0  Trouble concentrating 0 0 0  Moving slowly or fidgety/restless 0 0 0  Suicidal thoughts 0 0 0  PHQ-9 Score 0 0 0  Difficult doing work/chores  Not difficult at all Not difficult at all      11/24/2023    9:18 AM 06/04/2023    8:28 AM 06/23/2020    4:14 PM  GAD 7 : Generalized Anxiety Score  Nervous, Anxious, on Edge 0 0 0  Control/stop worrying 0 0 0  Worry too much - different things 0 0 0  Trouble relaxing 0 0 0  Restless 0 0 0  Easily annoyed or irritable 0 0 0  Afraid - awful might happen 0 0 0  Total GAD 7 Score 0 0 0  Anxiety Difficulty Not difficult at all Not difficult at all Not difficult at all     No results found for any visits on 04/22/24.    Assessment & Plan:   Loose stools -     TSH; Future -     CBC with Differential/Platelet; Future -     Comprehensive metabolic panel with GFR; Future -     Lipase; Future -     Ambulatory referral to Gastroenterology  Hypothyroidism due to Hashimoto thyroiditis -     TSH; Future -     T4, free; Future -     T3, free; Future  Recent onset of loose stools.   Discussed possible causes including chronic diarrhea, IBS-D, lactose  intolerance, thyroid  dysfunction.  Labs.  OTC imodium prn.  Food diary.  Referral to GI.  Return if symptoms worsen or fail to improve.   Clotilda JONELLE Single, MD

## 2024-05-04 DIAGNOSIS — E039 Hypothyroidism, unspecified: Secondary | ICD-10-CM | POA: Diagnosis not present

## 2024-05-04 DIAGNOSIS — N644 Mastodynia: Secondary | ICD-10-CM | POA: Diagnosis not present

## 2024-05-07 ENCOUNTER — Telehealth: Payer: Self-pay | Admitting: *Deleted

## 2024-05-07 NOTE — Telephone Encounter (Signed)
 Copied from CRM [id number]. Topic: Clinical - Request for Lab/Test Order >> May 07, 2024 12:52 PM Dedra B wrote: Reason for CRM: Pt would like repeat lab testing done: TSH and Free T4. Pls call pt if any additional information is needed.

## 2024-05-11 DIAGNOSIS — E039 Hypothyroidism, unspecified: Secondary | ICD-10-CM | POA: Diagnosis not present

## 2024-05-11 NOTE — Telephone Encounter (Signed)
 Called patient left VM to return call, Dr. Mercer would like to know reason for re-testing.

## 2024-05-19 DIAGNOSIS — R92343 Mammographic extreme density, bilateral breasts: Secondary | ICD-10-CM | POA: Diagnosis not present

## 2024-05-19 DIAGNOSIS — N644 Mastodynia: Secondary | ICD-10-CM | POA: Diagnosis not present

## 2024-05-19 DIAGNOSIS — N6322 Unspecified lump in the left breast, upper inner quadrant: Secondary | ICD-10-CM | POA: Diagnosis not present

## 2024-05-31 DIAGNOSIS — Z8639 Personal history of other endocrine, nutritional and metabolic disease: Secondary | ICD-10-CM | POA: Diagnosis not present

## 2024-05-31 DIAGNOSIS — R232 Flushing: Secondary | ICD-10-CM | POA: Diagnosis not present

## 2024-05-31 DIAGNOSIS — F419 Anxiety disorder, unspecified: Secondary | ICD-10-CM | POA: Diagnosis not present

## 2024-05-31 DIAGNOSIS — G4700 Insomnia, unspecified: Secondary | ICD-10-CM | POA: Diagnosis not present

## 2024-06-09 DIAGNOSIS — J029 Acute pharyngitis, unspecified: Secondary | ICD-10-CM | POA: Diagnosis not present

## 2024-06-09 DIAGNOSIS — J0140 Acute pansinusitis, unspecified: Secondary | ICD-10-CM | POA: Diagnosis not present

## 2024-06-22 DIAGNOSIS — S5002XA Contusion of left elbow, initial encounter: Secondary | ICD-10-CM | POA: Diagnosis not present

## 2024-07-08 DIAGNOSIS — R0981 Nasal congestion: Secondary | ICD-10-CM | POA: Diagnosis not present

## 2024-07-08 DIAGNOSIS — Z20822 Contact with and (suspected) exposure to covid-19: Secondary | ICD-10-CM | POA: Diagnosis not present

## 2024-07-08 DIAGNOSIS — R519 Headache, unspecified: Secondary | ICD-10-CM | POA: Diagnosis not present

## 2024-07-08 DIAGNOSIS — J029 Acute pharyngitis, unspecified: Secondary | ICD-10-CM | POA: Diagnosis not present

## 2024-07-12 DIAGNOSIS — E039 Hypothyroidism, unspecified: Secondary | ICD-10-CM | POA: Diagnosis not present

## 2024-07-12 DIAGNOSIS — R634 Abnormal weight loss: Secondary | ICD-10-CM | POA: Diagnosis not present

## 2024-07-12 DIAGNOSIS — L709 Acne, unspecified: Secondary | ICD-10-CM | POA: Diagnosis not present

## 2024-07-12 DIAGNOSIS — N926 Irregular menstruation, unspecified: Secondary | ICD-10-CM | POA: Diagnosis not present

## 2024-07-12 DIAGNOSIS — Z01411 Encounter for gynecological examination (general) (routine) with abnormal findings: Secondary | ICD-10-CM | POA: Diagnosis not present

## 2024-07-12 DIAGNOSIS — Z1331 Encounter for screening for depression: Secondary | ICD-10-CM | POA: Diagnosis not present

## 2024-07-14 ENCOUNTER — Ambulatory Visit: Admitting: Family Medicine

## 2024-07-14 ENCOUNTER — Encounter: Payer: Self-pay | Admitting: Family Medicine

## 2024-07-14 VITALS — BP 98/64 | HR 73 | Temp 98.6°F | Wt 120.4 lb

## 2024-07-14 DIAGNOSIS — B379 Candidiasis, unspecified: Secondary | ICD-10-CM | POA: Diagnosis not present

## 2024-07-14 DIAGNOSIS — J0101 Acute recurrent maxillary sinusitis: Secondary | ICD-10-CM | POA: Diagnosis not present

## 2024-07-14 DIAGNOSIS — T3695XA Adverse effect of unspecified systemic antibiotic, initial encounter: Secondary | ICD-10-CM | POA: Diagnosis not present

## 2024-07-14 MED ORDER — CEFTRIAXONE SODIUM 1 G IJ SOLR
1.0000 g | Freq: Once | INTRAMUSCULAR | Status: AC
  Administered 2024-07-14: 1 g via INTRAMUSCULAR

## 2024-07-14 MED ORDER — FLUCONAZOLE 150 MG PO TABS: ORAL_TABLET | ORAL | 0 refills | Status: DC

## 2024-07-14 MED ORDER — CEPHALEXIN 500 MG PO CAPS: 500.0000 mg | ORAL_CAPSULE | Freq: Two times a day (BID) | ORAL | 0 refills | Status: AC

## 2024-07-14 NOTE — Progress Notes (Signed)
 Established Patient Office Visit   Subjective  Patient ID: Kristine Taylor, female    DOB: [birth year]  Age: 33 y.o. MRN: [id number]  Chief Complaint  Patient presents with   Acute Visit    Patient came in today for sore throat along with right eye and right face swelling. Patient had a cold last Thursday and sinusitis a month ago.    Pt is a 33 year old female seen for acute concern.  Patient endorses fever Tmax 102F, sore throat, hoarse voice, pain/pressure in head with swelling of right side of face starting last Thursday.  Seen at [facility] on 07/08/2024 where COVID, flu, strep testing were negative.  Supportive care was advised.  Sore throat and fever are improving.  Productive cough worse at night.  Pain/discomfort in face have become worse.  Feels like eye has pain behind and swelling around R eyelid narrowing vision.  Patient denies pain actual eyeball, erythema of eye, drainage of eye, decreased EOM.  Patient was treated at the end of October with Augmentin  for sinusitis.  Notes symptoms completely resolved then returned with this most recent episode last week.    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    Permissive genes not present on [facility] testing   Chronic kidney disease    Esophageal dysmotility    GERD (gastroesophageal reflux disease)    Hashimoto's disease    History of stomach ulcers    Hypothyroidism    Lymphocytic hypophysitis    Shingles    Vaginal Pap smear, abnormal    Past Surgical History:  Procedure Laterality Date   ESOPHAGEAL MANOMETRY  2017   ESOPHAGOGASTRODUODENOSCOPY  12/2015   LEEP  2018   NASAL SEPTUM SURGERY     NOSE REPAIR  2006   [facility] IMPEDANCE STUDY  2017   Social History   Tobacco Use   Smoking status: Never   Smokeless tobacco: Never  Vaping Use   Vaping status: Never Used  Substance Use Topics   Alcohol use: Not Currently    Comment: OCCASIONAL   Drug use: Never   Family History  Problem Relation Age of Onset   Graves' disease Mother    Colon polyps Mother    Hyperlipidemia Father    Hypertension Father    Healthy Sister    Yvone' disease Maternal Grandfather    Allergies  Allergen Reactions   Gluten Meal    Sumatriptan  Other (See  Comments)    Jaw and shoulder pain   Wheat Other (See Comments)    ROS Negative unless stated above    Objective:     BP 98/64 (BP Location: Left Arm, Patient Position: Sitting, Cuff Size: Normal)   Pulse 73   Temp 98.6 F (37 C) (Oral)   Wt 120 lb 6.4 oz (54.6 kg)   SpO2 99%   BMI 20.04 kg/m  BP Readings from Last 3 Encounters:  07/14/24 98/64  04/22/24 102/64  01/27/24 100/70   Wt Readings from Last 3 Encounters:  07/14/24 120 lb 6.4 oz (54.6 kg)  04/22/24 128 lb (58.1 kg)  01/27/24 126 lb (57.2 kg)      Physical Exam Constitutional:      General: She is not in acute  distress.    Appearance: Normal appearance.  HENT:     Head: Normocephalic and atraumatic.     Ears:     Comments: B/l TMs full without erythema or suppurative fluid.    Nose: Nasal tenderness and congestion present.     Right Sinus: Maxillary sinus tenderness and frontal sinus tenderness present.     Left Sinus: No maxillary sinus tenderness or frontal sinus tenderness.     Comments: Edema of R cheek, lower eyelid, upper eyelid.    Mouth/Throat:     Mouth: Mucous membranes are moist.     Pharynx: Postnasal drip present.     Tonsils: No tonsillar exudate.  Cardiovascular:     Rate and Rhythm: Normal rate and regular rhythm.     Heart sounds: Normal heart sounds. No murmur heard.    No gallop.  Pulmonary:     Effort: Pulmonary effort is normal. No respiratory distress.     Breath sounds: Normal breath sounds. No wheezing, rhonchi or rales.  Skin:    General: Skin is warm and dry.  Neurological:     Mental Status: She is alert and oriented to person, place, and time.        04/22/2024    8:48 AM 11/24/2023    9:18 AM 06/04/2023    8:28 AM  Depression screen PHQ 2/9  Decreased Interest 0 0 0  Down, Depressed, Hopeless 0 0 0  PHQ - 2 Score 0 0 0  Altered sleeping 0 0 0  Tired, decreased energy 0 0 0  Change in appetite 0 0 0  Feeling bad or failure about yourself  0 0 0  Trouble concentrating 0 0 0  Moving slowly or fidgety/restless 0 0 0  Suicidal thoughts 0 0 0  PHQ-9 Score 0  0  0   Difficult doing work/chores   Not difficult at all     Data saved with a previous flowsheet row definition      04/22/2024    8:49 AM 11/24/2023    9:18 AM 06/04/2023    8:28 AM 06/23/2020    4:14 PM  GAD 7 : Generalized Anxiety Score  Nervous, Anxious, on Edge 0 0 0 0  Control/stop worrying 0 0 0 0  Worry too much - different things 0 0 0 0  Trouble relaxing 0 0 0 0  Restless 0 0 0 0  Easily annoyed or irritable 0 0 0 0  Afraid - awful might happen 0 0 0 0  Total GAD 7 Score 0 0 0 0   Anxiety Difficulty  Not difficult at all Not difficult at all Not difficult at all     No results found for  any visits on 07/14/24.    Assessment & Plan:   Acute recurrent maxillary sinusitis -     Cephalexin ; Take 1 capsule (500 mg total) by mouth 2 (two) times daily for 7 days.  Dispense: 14 capsule; Refill: 0 -     cefTRIAXone  Sodium  Antibiotic-induced yeast infection -     Fluconazole ; Take 1 tab now.  Repeat dose in 3 days if needed.  Dispense: 2 tablet; Refill: 0  Acute sinusitis.  Treated with Augmenting for sinusitis 3 wks ago.  Though symptoms completely resolved treat as recurrent.  D/t facial edema given Rocephin  in clinic.  Start keflex . Diflucan  for abx induced yeast infection.  Continue supportive care tylenol  and ibuprofen  alternating if needed.  Return if symptoms worsen or fail to improve.   Clotilda JONELLE Single, MD

## 2024-07-14 NOTE — Patient Instructions (Addendum)
 You were given a dose of antibiotics, Rocephin , in clinic.  You can wait and start the prescription for the similar antibiotics, Keflex  in the morning.

## 2024-07-19 DIAGNOSIS — E039 Hypothyroidism, unspecified: Secondary | ICD-10-CM | POA: Diagnosis not present

## 2024-08-04 DIAGNOSIS — E039 Hypothyroidism, unspecified: Secondary | ICD-10-CM | POA: Diagnosis not present

## 2024-08-09 DIAGNOSIS — L821 Other seborrheic keratosis: Secondary | ICD-10-CM | POA: Diagnosis not present

## 2024-08-09 DIAGNOSIS — L814 Other melanin hyperpigmentation: Secondary | ICD-10-CM | POA: Diagnosis not present

## 2024-08-09 DIAGNOSIS — D492 Neoplasm of unspecified behavior of bone, soft tissue, and skin: Secondary | ICD-10-CM | POA: Diagnosis not present

## 2024-08-09 DIAGNOSIS — D1801 Hemangioma of skin and subcutaneous tissue: Secondary | ICD-10-CM | POA: Diagnosis not present

## 2024-08-10 ENCOUNTER — Encounter: Payer: Self-pay | Admitting: Family Medicine

## 2024-08-11 ENCOUNTER — Other Ambulatory Visit: Payer: Self-pay | Admitting: Family Medicine

## 2024-08-11 MED ORDER — LEVOTHYROXINE SODIUM 125 MCG PO TABS: 125.0000 ug | ORAL_TABLET | Freq: Every day | ORAL | 1 refills | Status: DC

## 2024-09-01 ENCOUNTER — Encounter: Payer: Self-pay | Admitting: Internal Medicine

## 2024-09-01 ENCOUNTER — Ambulatory Visit: Admitting: Internal Medicine

## 2024-09-01 VITALS — BP 92/56 | HR 76 | Ht 65.0 in | Wt 122.5 lb

## 2024-09-01 DIAGNOSIS — K589 Irritable bowel syndrome without diarrhea: Secondary | ICD-10-CM

## 2024-09-01 NOTE — Patient Instructions (Addendum)
 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  Hopefully you will continue to improve. I have included some general instructions below, but do not think you need to implement any major diet changes based upon what you shared.  If you have additional concerns or questions, or something changes and you need help please contactme/us .  I appreciate the opportunity to care for you. 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 This condition developed after your recent gastrointestinal infection. Even though the infection has cleared, changes in your gut have led to ongoing symptoms such as abdominal pain, diarrhea, bloating, and changes in bowel habits.[1][2]  PI-IBS occurs because the infection caused changes in your gut bacteria, intestinal lining, and nerve sensitivity. These changes can persist even after the infection is gone.[1][3] While this can be frustrating, most people with PI-IBS improve over time, and there are many strategies to help manage your symptoms.  Dietary Recommendations   General Dietary Guidelines:[4]  - Eat regular meals and take time to eat; avoid skipping meals or leaving long gaps between eating  - Drink at least 8 cups of fluid per day, especially water or non-caffeinated drinks like herbal teas  - Limit tea and coffee to 3 cups per day  - Reduce alcohol and carbonated beverages  - Limit fresh fruit to 3 portions per day (about 80 grams per portion)  - If you have diarrhea, avoid sorbitol (an artificial sweetener found in sugar-free gum, candies, and some diet products)  - For gas and bloating, try eating oats (oatmeal or oat-based cereals) and up to 1 tablespoon of flaxseeds daily  Fiber Considerations:[4][5]  - You may need to reduce high-fiber foods initially, especially insoluble fiber  like wheat bran  - If increasing fiber, choose soluble fiber such as psyllium (Metamucil) or oats  - Adjust fiber intake gradually while monitoring your symptoms   Lifestyle Modifications   Physical Activity:[4]  - Engage in regular physical activity, which can help improve IBS symptoms  - Aim to incorporate exercise into your daily routine  - Discuss appropriate activity levels with your healthcare provider  Stress Management:[4]  - Make time for relaxation and leisure activities  - Consider stress-reduction techniques such as meditation, yoga, or deep breathing exercises  - Psychological stress can worsen IBS symptoms, so managing stress is an important part of treatment  Medication Options   Your healthcare provider may recommend medications based on your specific symptoms:[1][2]  - For diarrhea: Antidiarrheal medications, certain antibiotics (like rifaximin), or medications that affect serotonin receptors  - For abdominal pain: Antispasmodic medications or low-dose antidepressants  - For inflammation: In some cases, anti-inflammatory medications may be considered  - Probiotics: May be helpful when taken for at least 4 weeks at the manufacturer's recommended dose[4]  What to Expect   Prognosis:[7][8][9]  - Most people with PI-IBS improve over time, though recovery can take months to years  - Studies show that about 15% of people still have symptoms 8 years after the initial infection, but symptoms often become less severe  - Your prognosis is generally better than people with other forms of IBS  - Symptom patterns may change over time  When to Contact Your Healthcare Provider   Contact your doctor if you experience:  - Worsening symptoms despite treatment  - Unintentional weight loss  - Blood in your stool  -  Severe abdominal pain  - Fever  - Symptoms that significantly interfere with your daily activities

## 2024-09-01 NOTE — Progress Notes (Signed)
 "       Kristine Taylor 34 y.o. [birth year] [id number]  Assessment & Plan:   Encounter Diagnosis  Name Primary?   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 - Reviewed celiac genetic testing; no permissive HLA genes. - Confirmed euthyroid status from thyroid  function and medication history. - Advised to contact clinic if symptoms worsen, weight loss recurs, or new symptoms develop.  CC: Mercer Clotilda SAUNDERS, MD      Subjective:   Chief Complaint: Change in bowel habits  HPI Discussed the use of AI scribe software for clinical note transcription with the patient, who gave verbal consent to proceed.  History of Present Illness   Niyla Marone Lisle is a 34 year old female with prior esophageal dysmotility and reflux who presents for evaluation of increased bowel movement frequency and unintentional weight loss.  Altered Bowel Habits: - Sudden onset of increased bowel movement frequency began in summer 2025 - Frequency reached up to eight times daily for several months, now improved to approximately four times daily without intervention - Stools are soft, thin, formed, and sometimes mucousy; not watery - No fecal incontinence, urgency, or accidents - No nocturnal awakening specifically to defecate, though bowel movements have occurred during nighttime awakenings by her children - No abdominal cramping, gas, or rumbling - Dairy is consumed regularly without associated symptoms - Bowel habit changes are not interfering with quality of life - Symptoms have improved by approximately 50%  Unintentional Weight Loss: - Approximately eight pounds lost from September to November 2025 (128 lbs to 120 lbs) - Current weight is 122 lbs, similar to pre-pregnancy weight from 2020 - No changes in diet or exercise and no  attempts at weight loss - Clothes are looser and weight loss is described as jarring - Concern regarding unexplained weight loss, but no ongoing decline recently  Thyroid  Dysfunction: - History of hypothyroidism managed with Synthroid  for twelve years - TSH was normal in September 2025, elevated to 16 three weeks later, prompting increased Synthroid  dose - TSH normalized by early December 2025 - No missed doses or other medication changes  Esophageal Symptoms: - Remote history of severe esophageal reflux and dysmotility, previously treated with Dexilant  and intermittent Reglan  - Symptoms resolved after second pregnancy in January 2023, at which time Dexilant  was discontinued - No esophageal spasms in the past eight years  Gluten Sensitivity: - Gluten-free diet since age 74 after elevated IgA antibodies and diagnosis of celiac disease - Genetic testing at [facility] in 2017 indicated absence of permissive genes for celiac disease - Gluten avoidance continues        Wt Readings from Last 3 Encounters:  09/01/24 122 lb 8 oz (55.6 kg)  07/14/24 120 lb 6.4 oz (54.6 kg)  04/22/24 128 lb (58.1 kg)     Allergies[1] Active Medications[1] Past Medical History:  Diagnosis Date   Arthritis    Asthma    Celiac disease?    Permissive genes not present on [facility] testing   Chronic kidney disease    Esophageal dysmotility    GERD (gastroesophageal reflux disease)    Hashimoto's disease    History of stomach ulcers    Hypothyroidism    Lymphocytic hypophysitis    Shingles    Vaginal Pap smear, abnormal    Past Surgical History:  Procedure Laterality Date  ESOPHAGEAL MANOMETRY  2017   ESOPHAGOGASTRODUODENOSCOPY  12/2015   LEEP  2018   NASAL SEPTUM SURGERY     NOSE REPAIR  2006   [facility] IMPEDANCE STUDY  2017   Social History   Social History Narrative   Married, no children   Patent Examiner - [employer]   Raised in [place]   Rare alcohol, no tobacco  or drugs   Right handed       family history includes Colon polyps in her mother; Yvone' disease in her maternal grandfather and mother; Healthy in her sister; Hyperlipidemia in her father; Hypertension in her father.   Review of Systems As per HPI  Objective:   Physical Exam @BP  (!) 92/56   Pulse 76   Ht 5' 5 (1.651 m)   Wt 122 lb 8 oz (55.6 kg)   LMP 08/05/2024 (Exact Date)   SpO2 99%   BMI 20.39 kg/m @  General:  NAD Eyes:   anicteric Lungs:  clear Heart::  S1S2 no rubs, murmurs or gallops Abdomen:  soft and nontender, BS+ Ext:   no edema, cyanosis or clubbing    Data Reviewed:   See HPI and assessment plan    [1]  Allergies Allergen Reactions   Gluten Meal    Sumatriptan  Other (See Comments)    Jaw and shoulder pain   Wheat Other (See Comments)  [1]  Current Meds  Medication Sig   levothyroxine  (SYNTHROID ) 125 MCG tablet Take 1 tablet (125 mcg total) by mouth daily.   spironolactone (ALDACTONE) 25 MG tablet Take 1 tablet every day by oral route as directed for 90 days.   tretinoin (RETIN-A) 0.025 % cream Apply topically.   "

## 2024-09-10 ENCOUNTER — Other Ambulatory Visit: Payer: Self-pay

## 2024-09-10 DIAGNOSIS — E162 Hypoglycemia, unspecified: Secondary | ICD-10-CM

## 2024-09-10 DIAGNOSIS — E063 Autoimmune thyroiditis: Secondary | ICD-10-CM

## 2024-09-10 MED ORDER — LEVOTHYROXINE SODIUM 125 MCG PO TABS: 125.0000 ug | ORAL_TABLET | Freq: Every day | ORAL | 1 refills | Status: AC

## 2024-09-15 ENCOUNTER — Ambulatory Visit: Admitting: Family Medicine

## 2024-09-15 VITALS — BP 98/64 | HR 72 | Temp 98.5°F | Ht 65.0 in | Wt 121.8 lb

## 2024-09-15 DIAGNOSIS — Z006 Encounter for examination for normal comparison and control in clinical research program: Secondary | ICD-10-CM | POA: Diagnosis not present

## 2024-09-15 DIAGNOSIS — E063 Autoimmune thyroiditis: Secondary | ICD-10-CM

## 2024-09-15 DIAGNOSIS — Z0000 Encounter for general adult medical examination without abnormal findings: Secondary | ICD-10-CM

## 2024-09-15 LAB — CBC WITH DIFFERENTIAL/PLATELET
Basophils Absolute: 0 10*3/uL (ref 0.0–0.1)
Basophils Relative: 0.7 % (ref 0.0–3.0)
Eosinophils Absolute: 0.1 10*3/uL (ref 0.0–0.7)
Eosinophils Relative: 1.9 % (ref 0.0–5.0)
HCT: 40.5 % (ref 36.0–46.0)
Hemoglobin: 13.7 g/dL (ref 12.0–15.0)
Lymphocytes Relative: 37.1 % (ref 12.0–46.0)
Lymphs Abs: 2.2 10*3/uL (ref 0.7–4.0)
MCHC: 33.8 g/dL (ref 30.0–36.0)
MCV: 90.7 fl (ref 78.0–100.0)
Monocytes Absolute: 0.5 10*3/uL (ref 0.1–1.0)
Monocytes Relative: 8.2 % (ref 3.0–12.0)
Neutro Abs: 3 10*3/uL (ref 1.4–7.7)
Neutrophils Relative %: 52.1 % (ref 43.0–77.0)
Platelets: 255 10*3/uL (ref 150.0–400.0)
RBC: 4.46 Mil/uL (ref 3.87–5.11)
RDW: 13.8 % (ref 11.5–15.5)
WBC: 5.8 10*3/uL (ref 4.0–10.5)

## 2024-09-15 LAB — COMPREHENSIVE METABOLIC PANEL WITH GFR
ALT: 16 U/L (ref 3–35)
AST: 18 U/L (ref 5–37)
Albumin: 4.5 g/dL (ref 3.5–5.2)
Alkaline Phosphatase: 45 U/L (ref 39–117)
BUN: 14 mg/dL (ref 6–23)
CO2: 30 meq/L (ref 19–32)
Calcium: 9.8 mg/dL (ref 8.4–10.5)
Chloride: 103 meq/L (ref 96–112)
Creatinine, Ser: 0.62 mg/dL (ref 0.40–1.20)
GFR: 116.36 mL/min
Glucose, Bld: 91 mg/dL (ref 70–99)
Potassium: 3.8 meq/L (ref 3.5–5.1)
Sodium: 138 meq/L (ref 135–145)
Total Bilirubin: 0.4 mg/dL (ref 0.2–1.2)
Total Protein: 7.4 g/dL (ref 6.0–8.3)

## 2024-09-15 LAB — HEMOGLOBIN A1C: Hgb A1c MFr Bld: 5.6 % (ref 4.6–6.5)

## 2024-09-15 LAB — LIPID PANEL
Cholesterol: 143 mg/dL (ref 28–200)
HDL: 52.8 mg/dL
LDL Cholesterol: 78 mg/dL (ref 10–99)
NonHDL: 90.46
Total CHOL/HDL Ratio: 3
Triglycerides: 62 mg/dL (ref 10.0–149.0)
VLDL: 12.4 mg/dL (ref 0.0–40.0)

## 2024-09-15 LAB — GENECONNECT MOLECULAR SCREEN

## 2024-09-15 NOTE — Progress Notes (Signed)
 "  Established Patient Office Visit   Subjective  Patient ID: Kristine Taylor, female    DOB: [birth year]  Age: 34 y.o. MRN: [id number]  Chief Complaint  Patient presents with   Annual Exam    Patient is a 34 year old female seen for CPE.  Patient is not fasting.  Patient states she is doing well overall.  Prior stomach issues and hypoglycemic episodes have since resolved.  No longer having 8 BMs daily.  Seen by GI.  Advised may have had postviral IBS.  Has noticed some weight loss, ~6 lbs in the last month or 2 without trying.  Patient interested in seeing with TSHs.  Taking levothyroxine  125 mcg daily.  Thyroid  previously managed by OB/GYN but requests this provider take over.  Pt signed up for gene connect today.  Inquires if labs can be drawn at this visit.    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     Permissive genes not present on [facility] testing   Chronic kidney disease    Esophageal dysmotility    GERD (gastroesophageal reflux disease)    Hashimoto's disease    History of stomach ulcers    Hypothyroidism    Lymphocytic hypophysitis    Shingles    Vaginal Pap smear, abnormal    Past Surgical History:  Procedure Laterality Date   ESOPHAGEAL MANOMETRY  2017   ESOPHAGOGASTRODUODENOSCOPY  12/2015   LEEP  2018   NASAL SEPTUM SURGERY     NOSE REPAIR  2006   [facility] IMPEDANCE STUDY  2017   Social History[1] Family History  Problem Relation Age of Onset   Graves' disease Mother    Colon polyps Mother    Hyperlipidemia Father    Hypertension Father    Healthy Sister    Yvone' disease Maternal Grandfather    Allergies[2]  ROS Negative unless stated above    Objective:     BP 98/64 (BP Location: Left Arm, Patient Position: Sitting, Cuff Size: Normal)   Pulse 72   Temp 98.5 F (36.9 C) (Oral)   Ht 5' 5 (1.651 m)   Wt 121 lb 12.8 oz (55.2 kg)   LMP 09/04/2024 (Exact Date)   SpO2 100%   BMI 20.27 kg/m  BP Readings from Last 3 Encounters:  09/15/24 98/64  09/01/24 (!) 92/56  07/14/24 98/64   Wt Readings from Last 3 Encounters:  09/15/24 121 lb 12.8 oz (55.2 kg)  09/01/24 122 lb 8 oz (55.6 kg)  07/14/24 120 lb 6.4 oz (54.6 kg)      Physical Exam Constitutional:      Appearance: Normal appearance.  HENT:     Head: Normocephalic and atraumatic.     Right Ear: Tympanic membrane, ear canal and external ear normal.     Left Ear: Tympanic membrane, ear canal and external ear normal.     Nose: Nose normal.     Mouth/Throat:     Mouth: Mucous membranes are moist.     Pharynx: No oropharyngeal exudate or posterior oropharyngeal erythema.  Eyes:     General: No scleral icterus.    Extraocular Movements: Extraocular movements intact.     Conjunctiva/sclera: Conjunctivae normal.     Pupils: Pupils are equal, round, and reactive to light.  Neck:     Thyroid : No  thyromegaly.     Vascular: No carotid bruit.  Cardiovascular:     Rate and Rhythm: Normal rate and regular rhythm.     Pulses: Normal pulses.     Heart sounds: Normal heart sounds. No murmur heard.    No friction rub.  Pulmonary:     Effort: Pulmonary effort is normal.     Breath sounds: Normal breath sounds. No wheezing, rhonchi or rales.  Abdominal:     General: Bowel sounds are normal.     Palpations: Abdomen is soft.     Tenderness: There is no abdominal tenderness.  Musculoskeletal:        General: No deformity. Normal range of motion.  Lymphadenopathy:     Cervical: No cervical adenopathy.  Skin:    General: Skin is warm and dry.     Findings: No lesion.  Neurological:     General: No focal deficit present.     Mental Status: She is alert and oriented to person, place, and time.  Psychiatric:        Mood and Affect: Mood normal.        Thought Content: Thought content normal.        07/14/2024    1:40 PM 04/22/2024    8:48 AM 11/24/2023    9:18 AM  Depression screen PHQ 2/9  Decreased Interest 0 0 0  Down, Depressed, Hopeless 0 0 0  PHQ - 2 Score 0 0 0  Altered sleeping 0 0 0  Tired, decreased energy 0 0 0  Change in appetite 0 0 0  Feeling bad or failure about yourself  0 0 0  Trouble concentrating 0 0 0  Moving slowly or fidgety/restless 0 0 0  Suicidal thoughts 0 0 0  PHQ-9 Score 0 0  0      Data saved with a previous flowsheet row definition      07/14/2024    1:41 PM 04/22/2024    8:49 AM 11/24/2023    9:18 AM 06/04/2023    8:28 AM  GAD 7 : Generalized Anxiety Score  Nervous, Anxious, on Edge 0  0  0  0   Control/stop worrying 0  0  0  0   Worry too much - different things 0  0  0  0   Trouble relaxing 1  0  0  0   Restless 0  0  0  0   Easily annoyed or irritable 0  0  0  0   Afraid - awful might happen 0  0  0  0   Total GAD 7 Score 1 0  0 0  Anxiety Difficulty Not difficult at all  Not difficult at all Not difficult at all     Data saved with a  previous flowsheet row definition     Results for orders placed or performed in visit on 09/15/24  HM PAP SMEAR  Result Value Ref Range   HM Pap smear negative       Assessment & Plan:   Well adult exam -     TSH; Future -     CBC with Differential/Platelet; Future -     Comprehensive metabolic panel with GFR; Future -     Lipid panel; Future -     Hemoglobin A1c; Future  Hypothyroidism due to Hashimoto thyroiditis -     TSH; Future  Examination of participant in clinical trial -     GeneConnect Molecular Screen   Age-appropriate health screenings discussed.  Obtain labs.  Immunizations reviewed.  Mammogram not indicated.  Pap with [facility].  Recheck TSH.  If stable refill levothyroxine  125 mcg daily.  Patient wishes to participate in gene connect.  Will attempt to place order.  This provider was unable to place it, but Environmental Education Officer was.    No follow-ups on file.  Next CPE in 1 yr.  F/u sooner if needed for any other concerns.  Clotilda JONELLE Single, MD     [1]  Social History Tobacco Use   Smoking status: Never   Smokeless tobacco: Never  Vaping Use   Vaping status: Never Used  Substance Use Topics   Alcohol use: Not Currently    Comment: OCCASIONAL   Drug use: Never  [2]  Allergies Allergen Reactions   Gluten Meal    Sumatriptan  Other (See Comments)    Jaw and shoulder pain   Wheat Other (See Comments)   "

## 2024-09-16 ENCOUNTER — Ambulatory Visit: Payer: Self-pay | Admitting: Family Medicine

## 2024-09-16 LAB — TSH: TSH: 1.01 u[IU]/mL (ref 0.35–5.50)
